# Patient Record
Sex: Male | Born: 1960 | Race: Black or African American | Hispanic: No | Smoking: Current every day smoker
Health system: Southern US, Community
[De-identification: ages and names within clinical notes are randomized; demographics above are authoritative.]

## PROBLEM LIST (undated history)

## (undated) DIAGNOSIS — I10 Essential (primary) hypertension: Secondary | ICD-10-CM

---

## 2000-10-05 ENCOUNTER — Emergency Department (HOSPITAL_COMMUNITY): Admission: EM | Admit: 2000-10-05 | Discharge: 2000-10-05 | Payer: Self-pay | Admitting: Family Medicine

## 2003-11-22 ENCOUNTER — Emergency Department (HOSPITAL_COMMUNITY): Admission: EM | Admit: 2003-11-22 | Discharge: 2003-11-22 | Payer: Self-pay | Admitting: Emergency Medicine

## 2004-06-04 ENCOUNTER — Emergency Department (HOSPITAL_COMMUNITY): Admission: EM | Admit: 2004-06-04 | Discharge: 2004-06-04 | Payer: Self-pay | Admitting: *Deleted

## 2004-06-10 ENCOUNTER — Emergency Department (HOSPITAL_COMMUNITY): Admission: EM | Admit: 2004-06-10 | Discharge: 2004-06-10 | Payer: Self-pay | Admitting: Emergency Medicine

## 2007-06-22 ENCOUNTER — Emergency Department (HOSPITAL_COMMUNITY): Admission: EM | Admit: 2007-06-22 | Discharge: 2007-06-22 | Payer: Self-pay | Admitting: Emergency Medicine

## 2010-03-28 ENCOUNTER — Emergency Department (HOSPITAL_COMMUNITY): Admission: EM | Admit: 2010-03-28 | Discharge: 2010-03-28 | Payer: Self-pay | Admitting: Emergency Medicine

## 2010-06-17 ENCOUNTER — Emergency Department (HOSPITAL_COMMUNITY)
Admission: EM | Admit: 2010-06-17 | Discharge: 2010-06-17 | Payer: Self-pay | Source: Home / Self Care | Admitting: Emergency Medicine

## 2010-07-31 LAB — URINE MICROSCOPIC-ADD ON

## 2010-07-31 LAB — URINALYSIS, ROUTINE W REFLEX MICROSCOPIC
Bilirubin Urine: NEGATIVE
Glucose, UA: NEGATIVE mg/dL
Ketones, ur: NEGATIVE mg/dL
pH: 6 (ref 5.0–8.0)

## 2011-10-26 ENCOUNTER — Encounter (HOSPITAL_COMMUNITY): Payer: Self-pay

## 2011-10-26 ENCOUNTER — Emergency Department (HOSPITAL_COMMUNITY)
Admission: EM | Admit: 2011-10-26 | Discharge: 2011-10-26 | Discharge status: Against Medical Advice | Payer: Self-pay | Attending: Emergency Medicine | Admitting: Emergency Medicine

## 2011-10-26 ENCOUNTER — Emergency Department (HOSPITAL_COMMUNITY): Payer: Self-pay

## 2011-10-26 DIAGNOSIS — F17200 Nicotine dependence, unspecified, uncomplicated: Secondary | ICD-10-CM | POA: Insufficient documentation

## 2011-10-26 DIAGNOSIS — R079 Chest pain, unspecified: Secondary | ICD-10-CM | POA: Insufficient documentation

## 2011-10-26 DIAGNOSIS — I10 Essential (primary) hypertension: Secondary | ICD-10-CM | POA: Insufficient documentation

## 2011-10-26 HISTORY — DX: Essential (primary) hypertension: I10

## 2011-10-26 LAB — BASIC METABOLIC PANEL
Calcium: 9.4 mg/dL (ref 8.4–10.5)
Creatinine, Ser: 0.74 mg/dL (ref 0.50–1.35)
GFR calc non Af Amer: >90 mL/min (ref >90)
Sodium: 130 mEq/L — ABNORMAL LOW (ref 135–145)

## 2011-10-26 LAB — CBC
HCT: 41.3 % (ref 39.0–52.0)
Hemoglobin: 14.3 g/dL (ref 13.0–17.0)
RBC: 4.83 MIL/uL (ref 4.22–5.81)
WBC: 4.9 10*3/uL (ref 4.0–10.5)

## 2011-10-26 LAB — TROPONIN I: Troponin I: <0.3 ng/mL (ref <0.30)

## 2011-10-26 NOTE — ED Provider Notes (Signed)
History     CSN: [id number]  Arrival date & time 10/26/11  1541   First MD Initiated Contact with Patient 10/26/11 1606      Chief Complaint  Patient presents with  . Chest Pain    (Consider location/radiation/quality/duration/timing/severity/associated sxs/prior treatment) HPI Comments: Patient was eating lunch around 2 pm when he developed mid sternal chest pressure,  Which he describes as "needing to burp" but was unable.  He was eating pinto beans heavy with onions and also has been drinking etoh,  Stating he had 2 shots of liquor and 3 beers prior to lunch.  He walked up here (about 20 minutes away) and states that his pain persisted,  resolved prior to arrival, then flared slightly upon first arrival,  But now is resolved.  He denies every having any similar symptoms,  Also denies shortness of breath,  Diaphoresis, nausea or vomiting.  He does not have a primary doctor,  Has known htn,  But states has never taken any bp medication.  He denies headaches,  Visual changes,  Shortness of breath and chest pain (historically before today).    Patient is a 51 y.o. male presenting with chest pain. The history is provided by the patient and a friend.  Chest Pain At its most intense, the pain is at 4/10. The pain is currently at 0/10. The pain does not radiate. Pertinent negatives for primary symptoms include no fever, no shortness of breath, no cough, no wheezing, no palpitations, no abdominal pain, no nausea and no dizziness.  Pertinent negatives for associated symptoms include no numbness and no weakness. He tried nothing for the symptoms. Risk factors include alcohol intake, male gender and smoking/tobacco exposure.  His past medical history is significant for hypertension.     Past Medical History  Diagnosis Date  . Hypertension     History reviewed. No pertinent past surgical history.  No family history on file.  History  Substance Use Topics  . Smoking status: Current Everyday  Smoker  . Smokeless tobacco: Not on file  . Alcohol Use: 2.4 oz/week    4 Cans of beer per week      Review of Systems  Constitutional: Negative for fever.  HENT: Negative for congestion, sore throat and neck pain.   Eyes: Negative.   Respiratory: Negative for cough, chest tightness, shortness of breath and wheezing.   Cardiovascular: Positive for chest pain. Negative for palpitations.  Gastrointestinal: Negative for nausea, abdominal pain, diarrhea and constipation.  Genitourinary: Negative.   Musculoskeletal: Negative for joint swelling and arthralgias.  Skin: Negative.  Negative for rash and wound.  Neurological: Negative for dizziness, weakness, light-headedness, numbness and headaches.  Hematological: Negative.   Psychiatric/Behavioral: Negative.     Allergies  Review of patient's allergies indicates no known allergies.  Home Medications  No current outpatient prescriptions on file.  BP 186/100  Resp 20  Ht 6\' 4"  (1.93 m)  Wt 192 lb (87.091 kg)  BMI 23.37 kg/m2  SpO2 96%  Physical Exam  Nursing note and vitals reviewed. Constitutional: He appears well-developed and well-nourished.  HENT:  Head: Normocephalic and atraumatic.  Eyes: Conjunctivae are normal.  Neck: Normal range of motion.  Cardiovascular: Normal rate, regular rhythm, normal heart sounds and intact distal pulses.   Pulmonary/Chest: Effort normal and breath sounds normal. He has no wheezes.  Abdominal: Soft. Bowel sounds are normal. There is no tenderness.  Musculoskeletal: Normal range of motion.  Neurological: He is alert.  Skin: Skin is warm and  dry.  Psychiatric: He has a normal mood and affect.    ED Course  Procedures (including critical care time)  Labs Reviewed  CBC - Abnormal; Notable for the following:    Platelets 136 (*)    All other components within normal limits  BASIC METABOLIC PANEL  TROPONIN I   No results found.   1. Chest pain, unspecified       MDM  Chest  pain of unclear etiology,  Patient refuses to stay for workup,  Stating he feels fine now,  And "knows it was the onions on top of the alcohol I ate".  He prefers to get seen by a family doctor and plans to establish care with Dr Felecia Shelling.  Discussed possible risks of leaving including heart attack and death.  Pt ok with taking this risk.  Advised to return for any return of sx.        Burgess Amor, [place] 10/26/11 1642   Date: 10/26/2011  Rate: 101 rhthm: sinus tachycardia  QRS Axis: left  Intervals: QT prolonged  ST/T Wave abnormalities: nonspecific ST/T changes  Conduction Disutrbances:none  Narrative Interpretation: anterior q waves  Old EKG Reviewed: none available    Burgess Amor, PA 10/26/11 1648

## 2011-10-26 NOTE — ED Notes (Signed)
Pt states he ate pinto beans and onions for lunch. States he then started having chest pain

## 2011-10-26 NOTE — Discharge Instructions (Signed)
Chest Pain (Nonspecific) It is often hard to give a specific diagnosis for the cause of chest pain. There is always a chance that your pain could be related to something serious, such as a heart attack or a blood clot in the lungs. You need to follow up with your caregiver for further evaluation. CAUSES   Heartburn.   Pneumonia or bronchitis.   Anxiety or stress.   Inflammation around your heart (pericarditis) or lung (pleuritis or pleurisy).   A blood clot in the lung.   A collapsed lung (pneumothorax). It can develop suddenly on its own (spontaneous pneumothorax) or from injury (trauma) to the chest.   Shingles infection (herpes zoster virus).  The chest wall is composed of bones, muscles, and cartilage. Any of these can be the source of the pain.  The bones can be bruised by injury.   The muscles or cartilage can be strained by coughing or overwork.   The cartilage can be affected by inflammation and become sore (costochondritis).  DIAGNOSIS  Lab tests or other studies, such as X-rays, electrocardiography, stress testing, or cardiac imaging, may be needed to find the cause of your pain.  TREATMENT   Treatment depends on what may be causing your chest pain. Treatment may include:   Acid blockers for heartburn.   Anti-inflammatory medicine.   Pain medicine for inflammatory conditions.   Antibiotics if an infection is present.   You may be advised to change lifestyle habits. This includes stopping smoking and avoiding alcohol, caffeine, and chocolate.   You may be advised to keep your head raised (elevated) when sleeping. This reduces the chance of acid going backward from your stomach into your esophagus.   Most of the time, nonspecific chest pain will improve within 2 to 3 days with rest and mild pain medicine.  HOME CARE INSTRUCTIONS   If antibiotics were prescribed, take your antibiotics as directed. Finish them even if you start to feel better.   For the next few  days, avoid physical activities that bring on chest pain. Continue physical activities as directed.   Do not smoke.   Avoid drinking alcohol.   Only take over-the-counter or prescription medicine for pain, discomfort, or fever as directed by your caregiver.   Follow your caregiver's suggestions for further testing if your chest pain does not go away.   Keep any follow-up appointments you made. If you do not go to an appointment, you could develop lasting (chronic) problems with pain. If there is any problem keeping an appointment, you must call to reschedule.  SEEK MEDICAL CARE IF:   You think you are having problems from the medicine you are taking. Read your medicine instructions carefully.   Your chest pain does not go away, even after treatment.   You develop a rash with blisters on your chest.  SEEK IMMEDIATE MEDICAL CARE IF:   You have increased chest pain or pain that spreads to your arm, neck, jaw, back, or abdomen.   You develop shortness of breath, an increasing cough, or you are coughing up blood.   You have severe back or abdominal pain, feel nauseous, or vomit.   You develop severe weakness, fainting, or chills.   You have a fever.  THIS IS AN EMERGENCY. Do not wait to see if the pain will go away. Get medical help at once. Call your local emergency services (911 in [place] Do not drive yourself to the hospital. MAKE SURE YOU:   Understand these instructions.     Will watch your condition.   Will get help right away if you are not doing well or get worse.  Document Released: 02/13/2005 Document Revised: 04/25/2011 Document Reviewed: 12/10/2007 [employer] Patient Information 2012 [employer]. 

## 2011-10-30 NOTE — ED Provider Notes (Signed)
Medical screening examination/treatment/procedure(s) were performed by non-physician practitioner and as supervising physician I was immediately available for consultation/collaboration.   Laray Anger, DO 10/30/11 1650

## 2017-06-24 ENCOUNTER — Encounter (HOSPITAL_COMMUNITY): Payer: Self-pay | Admitting: Emergency Medicine

## 2017-06-24 ENCOUNTER — Emergency Department (HOSPITAL_COMMUNITY)
Admission: EM | Admit: 2017-06-24 | Discharge: 2017-06-24 | Disposition: A | Discharge status: Home / Self Care | Payer: Self-pay | Attending: Emergency Medicine | Admitting: Emergency Medicine

## 2017-06-24 DIAGNOSIS — I10 Essential (primary) hypertension: Secondary | ICD-10-CM | POA: Insufficient documentation

## 2017-06-24 DIAGNOSIS — Y939 Activity, unspecified: Secondary | ICD-10-CM | POA: Insufficient documentation

## 2017-06-24 DIAGNOSIS — Y999 Unspecified external cause status: Secondary | ICD-10-CM | POA: Insufficient documentation

## 2017-06-24 DIAGNOSIS — F17210 Nicotine dependence, cigarettes, uncomplicated: Secondary | ICD-10-CM | POA: Insufficient documentation

## 2017-06-24 DIAGNOSIS — X500XXA Overexertion from strenuous movement or load, initial encounter: Secondary | ICD-10-CM | POA: Insufficient documentation

## 2017-06-24 DIAGNOSIS — Y929 Unspecified place or not applicable: Secondary | ICD-10-CM | POA: Insufficient documentation

## 2017-06-24 DIAGNOSIS — S39012A Strain of muscle, fascia and tendon of lower back, initial encounter: Secondary | ICD-10-CM | POA: Insufficient documentation

## 2017-06-24 MED ORDER — IBUPROFEN 600 MG PO TABS: 600.0000 mg | ORAL_TABLET | Freq: Four times a day (QID) | ORAL | 0 refills | Status: DC

## 2017-06-24 MED ORDER — CYCLOBENZAPRINE HCL 10 MG PO TABS: 10.0000 mg | ORAL_TABLET | Freq: Three times a day (TID) | ORAL | 0 refills | Status: DC

## 2017-06-24 NOTE — Discharge Instructions (Signed)
Please use a heating pad to your lower back.  Please rest her back as much as possible.  Use Flexeril 3 times daily for spasm pain. This medication may cause drowsiness. Please do not drink, drive, or participate in activity that requires concentration while taking this medication.  Use ibuprofen with breakfast, lunch, dinner, and at bedtime.

## 2017-06-24 NOTE — ED Triage Notes (Signed)
Pt reports picking something heavy up at work on Friday and has been having low back pain with bending or twisting.  States " I think I need some time off of work to rest my back a bit."

## 2017-06-24 NOTE — ED Provider Notes (Signed)
[facility] Provider Note   CSN: [id number] Arrival date & time: 06/24/17  1356     History   Chief Complaint Chief Complaint  Patient presents with  . Back Pain    HPI [facility] is a 57 y.o. male.  Patient is a 57 year old male who presents to the emergency department with a complaint of lower back pain.  Patient states that he was lifting a heavy object for 5 days ago and injured his lower back.  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  He did not have any loss of bowel or bladder function.  He has been able to ambulate, but states is been very stiff and has pain when he changes positions.  He is not had any recent operations or procedures involving his back.  He is not had any difficulty with numbness or unusual tingling in the saddle areas.  He presents now for evaluation and assistance with this pain.   The history is provided by the patient.    Past Medical History:  Diagnosis Date  . Hypertension     There are no active problems to display for this patient.   History reviewed. No pertinent surgical history.     Home Medications    Prior to Admission medications   Not on File    Family History History reviewed. No pertinent family history.  Social History Social History   Tobacco Use  . Smoking status: Current Every Day Smoker    Packs/day: 0.50    Types: Cigarettes  Substance Use Topics  . Alcohol use: Yes    Alcohol/week: 2.4 oz    Types: 4 Cans of beer per week    Comment: weekends  . Drug use: No     Allergies   Patient has no known allergies.   Review of Systems Review of Systems  Constitutional: Negative for activity change.       All ROS Neg except as noted in HPI  HENT: Negative for nosebleeds.   Eyes: Negative for photophobia and discharge.  Respiratory: Negative for cough, shortness of breath and wheezing.   Cardiovascular: Negative for chest pain and palpitations.    Gastrointestinal: Negative for abdominal pain and blood in stool.  Genitourinary: Negative for dysuria, frequency and hematuria.  Musculoskeletal: Positive for back pain. Negative for arthralgias and neck pain.  Skin: Negative.   Neurological: Negative for dizziness, seizures and speech difficulty.  Psychiatric/Behavioral: Negative for confusion and hallucinations.     Physical Exam Updated Vital Signs BP (!) 189/90 (BP Location: Right Arm)   Pulse 74   Temp 98.1 F (36.7 C) (Oral)   Resp 18   Ht 6' (1.829 m)   Wt 85.7 kg (189 lb)   SpO2 100%   BMI 25.63 kg/m   Physical Exam  Constitutional: He is oriented to person, place, and time. He appears well-developed and well-nourished.  Non-toxic appearance.  HENT:  Head: Normocephalic.  Right Ear: Tympanic membrane and external ear normal.  Left Ear: Tympanic membrane and external ear normal.  Eyes: EOM and lids are normal. Pupils are equal, round, and reactive to light.  Neck: Normal range of motion. Neck supple. Carotid bruit is not present.  Cardiovascular: Normal rate, regular rhythm, normal heart sounds, intact distal pulses and normal pulses.  Pulmonary/Chest: Breath sounds normal. No respiratory distress.  Abdominal: Soft. Bowel sounds are normal. There is no tenderness. There is no guarding.  Musculoskeletal: Normal range of motion.  Lumbar back: He exhibits pain and spasm.       Back:  Lymphadenopathy:       Head (right side): No submandibular adenopathy present.       Head (left side): No submandibular adenopathy present.    He has no cervical adenopathy.  Neurological: He is alert and oriented to person, place, and time. He has normal strength. No cranial nerve deficit or sensory deficit.  Skin: Skin is warm and dry.  Psychiatric: He has a normal mood and affect. His speech is normal.  Nursing note and vitals reviewed.    ED Treatments / Results  Labs (all labs ordered are listed, but only abnormal  results are displayed) Labs Reviewed - No data to display  EKG  EKG Interpretation None       Radiology No results found.  Procedures Procedures (including critical care time)  Medications Ordered in ED Medications - No data to display   Initial Impression / Assessment and Plan / ED Course  I have reviewed the triage vital signs and the nursing notes.  Pertinent labs & imaging results that were available during my care of the patient were reviewed by me and considered in my medical decision making (see chart for details).      Final Clinical Impressions(s) / ED Diagnoses MDM  Blood pressure is slightly elevated at 189/90, otherwise vital signs are within normal limits.  Pulse oximetry is 100% on room air.  Within normal limits by my interpretation.  No gross neurologic deficit appreciated.  I can reproduce the pain with range of motion.  No gross neurologic deficit appreciated.  Prescription for Flexeril and ibuprofen given to the patient.  Patient will follow up with his primary physician, and or orthopedics if not improving.  Patient is in agreement with this plan.   Final diagnoses:  Strain of lumbar region, initial encounter    ED Discharge Orders        Ordered    cyclobenzaprine (FLEXERIL) 10 MG tablet  3 times daily     06/24/17 1506    ibuprofen (ADVIL,MOTRIN) 600 MG tablet  4 times daily     06/24/17 1506       Ivery Quale, PA-C 06/24/17 1754    Raeford Razor, MD 06/25/17 1242

## 2019-02-28 ENCOUNTER — Emergency Department (HOSPITAL_COMMUNITY)
Admission: EM | Admit: 2019-02-28 | Discharge: 2019-02-28 | Disposition: A | Discharge status: Home / Self Care | Payer: Self-pay | Attending: Emergency Medicine | Admitting: Emergency Medicine

## 2019-02-28 ENCOUNTER — Encounter (HOSPITAL_COMMUNITY): Payer: Self-pay

## 2019-02-28 ENCOUNTER — Other Ambulatory Visit: Payer: Self-pay

## 2019-02-28 DIAGNOSIS — F17210 Nicotine dependence, cigarettes, uncomplicated: Secondary | ICD-10-CM | POA: Insufficient documentation

## 2019-02-28 DIAGNOSIS — M545 Low back pain, unspecified: Secondary | ICD-10-CM

## 2019-02-28 DIAGNOSIS — I10 Essential (primary) hypertension: Secondary | ICD-10-CM | POA: Insufficient documentation

## 2019-02-28 LAB — URINALYSIS, ROUTINE W REFLEX MICROSCOPIC
Bacteria, UA: NONE SEEN
Bilirubin Urine: NEGATIVE
Glucose, UA: NEGATIVE mg/dL
Hgb urine dipstick: NEGATIVE
Ketones, ur: NEGATIVE mg/dL
Leukocytes,Ua: NEGATIVE
Nitrite: NEGATIVE
Protein, ur: 100 mg/dL — AB
Specific Gravity, Urine: 1.021 (ref 1.005–1.030)
pH: 6 (ref 5.0–8.0)

## 2019-02-28 MED ORDER — AMLODIPINE BESYLATE 5 MG PO TABS: 5.0000 mg | ORAL_TABLET | Freq: Every day | ORAL | 0 refills | Status: DC

## 2019-02-28 MED ORDER — TRAMADOL HCL 50 MG PO TABS: 50.0000 mg | ORAL_TABLET | Freq: Four times a day (QID) | ORAL | 0 refills | Status: DC | PRN

## 2019-02-28 MED ORDER — CYCLOBENZAPRINE HCL 10 MG PO TABS: 10.0000 mg | ORAL_TABLET | Freq: Three times a day (TID) | ORAL | 0 refills | Status: DC | PRN

## 2019-02-28 NOTE — ED Notes (Signed)
Pt ambulatory in hall twice.  Given two cups of sprite.  Pt moving all extremities well.  Pt asking " how much longer is he going to stay here"

## 2019-02-28 NOTE — ED Triage Notes (Signed)
Pt reports pain in r lower back that started when he got up this morning.

## 2019-02-28 NOTE — ED Provider Notes (Signed)
[facility] EMERGENCY DEPARTMENT Provider Note   CSN: [id number] Arrival date & time: 02/28/19  0907     History   Chief Complaint Chief Complaint  Patient presents with  . Back Pain    HPI [facility] is a 59 y.o. male.     HPI   [facility] is a 58 y.o. male who presents to the Emergency Department complaining of right-sided low back pain that has been waxing and waning for 1-1/2 weeks.  He states he woke this morning and the pain was worse.  Has been localized to the right side of his lower back.  Pain is aggravated by walking and certain movements.  Improves at rest.  He denies known injury.  Pain is nonradiating.  He has not tried any medications for symptom relief.  He denies fever, chills, abdominal pain, pain numbness or weakness of his lower extremities, urine or bowel changes.  No history of kidney stones. No pain or swelling of the testicles.  No penile discharge.     Past Medical History:  Diagnosis Date  . Hypertension     There are no active problems to display for this patient.   History reviewed. No pertinent surgical history.     Home Medications    Prior to Admission medications   Medication Sig Start Date End Date Taking? Authorizing Provider  cyclobenzaprine (FLEXERIL) 10 MG tablet Take 1 tablet (10 mg total) by mouth 3 (three) times daily. 06/24/17   Lily Kocher, PA-C  ibuprofen (ADVIL,MOTRIN) 600 MG tablet Take 1 tablet (600 mg total) by mouth 4 (four) times daily. 06/24/17   Lily Kocher, PA-C    Family History No family history on file.  Social History Social History   Tobacco Use  . Smoking status: Current Every Day Smoker    Packs/day: 0.50    Types: Cigarettes  . Smokeless tobacco: Never Used  Substance Use Topics  . Alcohol use: Yes    Alcohol/week: 4.0 standard drinks    Types: 4 Cans of beer per week    Comment: weekends  . Drug use: No     Allergies   Patient has no known allergies.   Review of Systems  Review of Systems  Constitutional: Negative for fever.  Respiratory: Negative for shortness of breath.   Cardiovascular: Negative for chest pain.  Gastrointestinal: Negative for abdominal pain, constipation, diarrhea, nausea and vomiting.  Genitourinary: Negative for decreased urine volume, difficulty urinating, discharge, dysuria, flank pain, hematuria, penile swelling and testicular pain.  Musculoskeletal: Positive for back pain. Negative for joint swelling.  Skin: Negative for rash.  Neurological: Negative for weakness and numbness.     Physical Exam Updated Vital Signs BP (!) 210/109 (BP Location: Right Arm)   Pulse 97   Temp 99 F (37.2 C) (Oral)   Resp 18   Ht 6' (1.829 m)   Wt 72.6 kg   SpO2 100%   BMI 21.70 kg/m   Physical Exam Vitals signs and nursing note reviewed.  Constitutional:      General: He is not in acute distress.    Appearance: Normal appearance. He is well-developed.  HENT:     Head: Normocephalic.  Neck:     Musculoskeletal: Normal range of motion and neck supple.  Cardiovascular:     Rate and Rhythm: Normal rate and regular rhythm.     Pulses: Normal pulses.     Comments: DP pulses are strong and palpable bilaterally Pulmonary:     Effort:  Pulmonary effort is normal. No respiratory distress.     Breath sounds: Normal breath sounds.  Abdominal:     General: There is no distension.     Palpations: Abdomen is soft.     Tenderness: There is no abdominal tenderness. There is no right CVA tenderness or left CVA tenderness.  Musculoskeletal:        General: Tenderness present.     Lumbar back: He exhibits tenderness and pain. He exhibits normal range of motion, no swelling, no deformity, no laceration and normal pulse.     Comments: Focal ttp of the right lumbar paraspinal muscles.  No spinal tenderness.  Pt has 5/5 strength against resistance of bilateral lower extremities.  Negative SLR bilaterally   Skin:    General: Skin is warm.     Capillary  Refill: Capillary refill takes less than 2 seconds.     Findings: No rash.  Neurological:     Mental Status: He is alert and oriented to person, place, and time.     Motor: No weakness or abnormal muscle tone.     Coordination: Coordination normal.     Gait: Gait normal.     Deep Tendon Reflexes:     Reflex Scores:      Patellar reflexes are 2+ on the right side and 2+ on the left side.      Achilles reflexes are 2+ on the right side and 2+ on the left side.     ED Treatments / Results  Labs (all labs ordered are listed, but only abnormal results are displayed) Labs Reviewed  URINALYSIS, ROUTINE W REFLEX MICROSCOPIC - Abnormal; Notable for the following components:      Result Value   Color, Urine AMBER (*)    APPearance HAZY (*)    Protein, ur 100 (*)    All other components within normal limits    EKG None  Radiology No results found.  Procedures Procedures (including critical care time)  Medications Ordered in ED Medications - No data to display   Initial Impression / Assessment and Plan / ED Course  I have reviewed the triage vital signs and the nursing notes.  Pertinent labs & imaging results that were available during my care of the patient were reviewed by me and considered in my medical decision making (see chart for details).        Pt is is hypertensive on arrival.  No known hx, but has not seen a primary provider "in a long time."  Denies symptoms 1200  on recheck, BP 191/97 I will start him on low dose norvasc and he agrees to arrange care with [facility] and I will also give info for the free clinic.  He understands that his blood pressure will need to be rechecked in 1 to 2 weeks.  Back pain is felt to be musculoskeletal.  No focal neuro deficits.  Ambulates with a steady gait.  Return precautions discussed.  Final Clinical Impressions(s) / ED Diagnoses   Final diagnoses:  Acute right-sided low back pain without sciatica  Hypertension,  unspecified type    ED Discharge Orders    None       Pauline Aus, PA-C 02/28/19 1222    Vanetta Mulders, MD 03/04/19 1531

## 2019-02-28 NOTE — Discharge Instructions (Addendum)
Alternate ice and heat to your back.  Avoid twisting or heavy lifting for at least 1 week.  Your blood pressure today is elevated.  I am starting you on a low dose blood pressure medication to take once daily.  You will need to have your blood pressure rechecked in 1 to 2 weeks.  I have listed 2 of the local clinics that you can contact to establish primary care.  Return to the ER for any worsening symptoms.

## 2019-03-02 ENCOUNTER — Telehealth: Payer: Self-pay

## 2019-03-02 NOTE — Congregational Nurse Program (Signed)
  Dept: [phone]   Congregational Nurse Program Note  Date of Encounter: 03/02/2019  Past Medical History: Past Medical History:  Diagnosis Date  . Hypertension     Encounter Details: CNP Questionnaire - 03/02/19 1548      Questionnaire   Patient Status  Not Applicable    Race  Black or African American    Location Patient Served At  [facility]  Not Applicable    Uninsured  Uninsured (NEW 1x/quarter)    Food  No food insecurities    Housing/Utilities  Yes, have permanent housing    Transportation  No transportation needs    Interpersonal Safety  Yes, feel physically and emotionally safe where you currently live    Medication  Yes, have medication insecurities;Provided medication assistance    Medical Provider  No    Referrals  Medication Assistance;[facility];Primary Care Provider/Clinic    ED Visit Averted  Yes    Life-Saving Intervention Made  Not Applicable       Client recently seen at [facility] on 02/28/19 for lower back pain. During visit it was discovered that client was hypertensive. Prescriptions sent to [facility] for  Amlodipine 5 mg one tablet by mouth daily Tramadol 50 mg Flexeril 10 mg.  Client reports his back is no longer hurting, but states he was referred to [place] for medical care and help with medications. Explained to client this office is not a primary medical care, however we can help connect him to a medical provider. He reports no "family physician" for many years. Never knew he had hypertension until ER visit.  Explained to client we cannot help with Tramadol and he says he doesn't need it. Will fax Reva Bores voucher for a one time assistance with amlodipine and client is to begin today, confirmed that prescription is ready at pharmacy.  Client is alert and oriented to person, place and time. He denies any shortness of breath, chest pains, gait is steady and balance is steady. Denies weakness of  extremities, no facial drooping, face symmetrical. No slurred speech. Denies headache or vision changes.  Client works cleaning carpets. He is uninsured. He lives with total of 3 in household. Client referred for [facility] to refer [facility]. Client met with Waylan Boga to begin application process.   Today: Blood pressure 180/100 , pulse 100 REsp 14, oxygen Saturation 98%.   Strict instructions to begin blood pressure tonight, discussed decreasing salt intake, fried fatty foods and smoking cessation. Discussed the risks of uncontrolled high blood pressure. Handouts given on hypertension, smoking and stroke prevention.  Reviewed symptoms of heart attack and stroke and to call 911 for any of the symptoms. These were written down and given to client along with handouts and voucher to pick up medication at [facility].  Discussed client coming in on Thursday 03/04/19 for BP check. Client agreeable. Discussed the importance of completing [facility] Connect application in order to get him to a primary care provider ASAP. Client reports understanding.  Will continue to follow as needed.  Debria Garret RN

## 2019-03-16 ENCOUNTER — Emergency Department (HOSPITAL_COMMUNITY)
Admission: EM | Admit: 2019-03-16 | Discharge: 2019-03-16 | Disposition: A | Discharge status: Home / Self Care | Payer: Self-pay | Attending: Emergency Medicine | Admitting: Emergency Medicine

## 2019-03-16 ENCOUNTER — Other Ambulatory Visit: Payer: Self-pay

## 2019-03-16 ENCOUNTER — Encounter (HOSPITAL_COMMUNITY): Payer: Self-pay

## 2019-03-16 DIAGNOSIS — I10 Essential (primary) hypertension: Secondary | ICD-10-CM | POA: Insufficient documentation

## 2019-03-16 DIAGNOSIS — R109 Unspecified abdominal pain: Secondary | ICD-10-CM | POA: Insufficient documentation

## 2019-03-16 DIAGNOSIS — Z79899 Other long term (current) drug therapy: Secondary | ICD-10-CM | POA: Insufficient documentation

## 2019-03-16 DIAGNOSIS — F17210 Nicotine dependence, cigarettes, uncomplicated: Secondary | ICD-10-CM | POA: Insufficient documentation

## 2019-03-16 LAB — URINALYSIS, ROUTINE W REFLEX MICROSCOPIC
Bilirubin Urine: NEGATIVE
Glucose, UA: NEGATIVE mg/dL
Ketones, ur: NEGATIVE mg/dL
Leukocytes,Ua: NEGATIVE
Nitrite: NEGATIVE
Protein, ur: >=300 mg/dL — AB
Specific Gravity, Urine: 1.025 (ref 1.005–1.030)
pH: 6 (ref 5.0–8.0)

## 2019-03-16 MED ORDER — CYCLOBENZAPRINE HCL 10 MG PO TABS: 10.0000 mg | ORAL_TABLET | Freq: Three times a day (TID) | ORAL | 0 refills | Status: DC | PRN

## 2019-03-16 NOTE — ED Triage Notes (Signed)
Pt presents to ED with complaints of right sided flank pain since this morning. Pt denies UA symptoms and fever.

## 2019-03-16 NOTE — ED Provider Notes (Signed)
[facility] EMERGENCY DEPARTMENT Provider Note   CSN: [id number] Arrival date & time: 03/16/19  1051     History   Chief Complaint Chief Complaint  Patient presents with  . Flank Pain    HPI Peter Meyer is a 58 y.o. male.     Patient is a 58 year old male with history of hypertension.  He presents today with complaints of pain in his right side.  This began this morning while he was at work.  It began while he was lifting a 40 pound box.  He denies any radiation of his discomfort into his legs.  He denies any fevers or chills.  He denies any weakness, numbness, or tingling.  He denies any bowel or bladder complaints.  Pain is actually improved significantly while waiting in the waiting room an extended period of time.  The history is provided by the patient.  Flank Pain This is a new problem. Episode onset: 9 AM. The problem occurs constantly. The problem has been resolved. Pertinent negatives include no abdominal pain. The symptoms are aggravated by bending and twisting. Nothing relieves the symptoms. He has tried nothing for the symptoms.    Past Medical History:  Diagnosis Date  . Hypertension     There are no active problems to display for this patient.   History reviewed. No pertinent surgical history.      Home Medications    Prior to Admission medications   Medication Sig Start Date End Date Taking? Authorizing Provider  amLODipine (NORVASC) 5 MG tablet Take 1 tablet (5 mg total) by mouth daily. 02/28/19   Triplett, Tammy, PA-C  cyclobenzaprine (FLEXERIL) 10 MG tablet Take 1 tablet (10 mg total) by mouth 3 (three) times daily as needed. 02/28/19   Triplett, Tammy, PA-C  ibuprofen (ADVIL,MOTRIN) 600 MG tablet Take 1 tablet (600 mg total) by mouth 4 (four) times daily. Patient not taking: Reported on 02/28/2019 06/24/17   Ivery Quale, PA-C  traMADol (ULTRAM) 50 MG tablet Take 1 tablet (50 mg total) by mouth every 6 (six) hours as needed. 02/28/19    Triplett, Babette Relic, PA-C    Family History No family history on file.  Social History Social History   Tobacco Use  . Smoking status: Current Every Day Smoker    Packs/day: 0.50    Types: Cigarettes  . Smokeless tobacco: Never Used  Substance Use Topics  . Alcohol use: Yes    Alcohol/week: 4.0 standard drinks    Types: 4 Cans of beer per week    Comment: weekends  . Drug use: No     Allergies   Patient has no known allergies.   Review of Systems Review of Systems  Gastrointestinal: Negative for abdominal pain.  Genitourinary: Positive for flank pain.  All other systems reviewed and are negative.    Physical Exam Updated Vital Signs BP (!) 191/111 (BP Location: Right Arm)   Pulse 87   Temp 98.9 F (37.2 C) (Oral)   Resp 18   Ht 6\' 8"  (2.032 m)   Wt 81.6 kg   SpO2 100%   BMI 19.77 kg/m   Physical Exam Vitals signs and nursing note reviewed.  Constitutional:      General: He is not in acute distress.    Appearance: He is well-developed. He is not diaphoretic.  HENT:     Head: Normocephalic and atraumatic.  Neck:     Musculoskeletal: Normal range of motion and neck supple.  Cardiovascular:     Rate and  Rhythm: Normal rate and regular rhythm.     Heart sounds: No murmur. No friction rub.  Pulmonary:     Effort: Pulmonary effort is normal. No respiratory distress.     Breath sounds: Normal breath sounds. No wheezing or rales.  Abdominal:     General: Bowel sounds are normal. There is no distension.     Palpations: Abdomen is soft.     Tenderness: There is no abdominal tenderness. There is no right CVA tenderness or left CVA tenderness.  Musculoskeletal: Normal range of motion.  Skin:    General: Skin is warm and dry.  Neurological:     Mental Status: He is alert and oriented to person, place, and time.     Coordination: Coordination normal.      ED Treatments / Results  Labs (all labs ordered are listed, but only abnormal results are displayed)  Labs Reviewed  URINALYSIS, ROUTINE W REFLEX MICROSCOPIC    EKG None  Radiology No results found.  Procedures Procedures (including critical care time)  Medications Ordered in ED Medications - No data to display   Initial Impression / Assessment and Plan / ED Course  I have reviewed the triage vital signs and the nursing notes.  Pertinent labs & imaging results that were available during my care of the patient were reviewed by me and considered in my medical decision making (see chart for details).  Patient presenting here with complaints of pain in his right side.  This began while he was at work lifting a heavy object.  He now feels better and his pain is resolved.  I suspect a musculoskeletal etiology.  His urinalysis is essentially clear.  Patient offered additional work-up, however states he feels better and wants to go home.  He prefers to return if symptoms worsen rather than undergo additional testing.  Final Clinical Impressions(s) / ED Diagnoses   Final diagnoses:  None    ED Discharge Orders    None       Veryl Speak, MD 03/16/19 1616

## 2019-03-16 NOTE — Discharge Instructions (Addendum)
Take ibuprofen 600 mg every 6 hours as needed for pain.  You can take Flexeril as prescribed as needed for pain not relieved with ibuprofen.  Follow-up with your primary doctor in the next week if not improving, and return to the ER if symptoms significantly worsen or change in the meantime.

## 2020-05-10 NOTE — Telephone Encounter (Signed)
na

## 2020-05-23 ENCOUNTER — Encounter (HOSPITAL_COMMUNITY): Payer: Self-pay | Admitting: Emergency Medicine

## 2020-05-23 ENCOUNTER — Other Ambulatory Visit: Payer: Self-pay

## 2020-05-23 ENCOUNTER — Emergency Department (HOSPITAL_COMMUNITY)
Admission: EM | Admit: 2020-05-23 | Discharge: 2020-05-23 | Disposition: A | Discharge status: Home / Self Care | Payer: PRIVATE HEALTH INSURANCE | Attending: Emergency Medicine | Admitting: Emergency Medicine

## 2020-05-23 DIAGNOSIS — M545 Low back pain, unspecified: Secondary | ICD-10-CM | POA: Insufficient documentation

## 2020-05-23 DIAGNOSIS — I10 Essential (primary) hypertension: Secondary | ICD-10-CM | POA: Diagnosis not present

## 2020-05-23 DIAGNOSIS — F17210 Nicotine dependence, cigarettes, uncomplicated: Secondary | ICD-10-CM | POA: Insufficient documentation

## 2020-05-23 DIAGNOSIS — Z79899 Other long term (current) drug therapy: Secondary | ICD-10-CM | POA: Insufficient documentation

## 2020-05-23 MED ORDER — CYCLOBENZAPRINE HCL 10 MG PO TABS: 10.0000 mg | ORAL_TABLET | Freq: Three times a day (TID) | ORAL | 0 refills | Status: DC | PRN

## 2020-05-23 MED ORDER — AMLODIPINE BESYLATE 10 MG PO TABS: 10.0000 mg | ORAL_TABLET | Freq: Every day | ORAL | 0 refills | Status: DC

## 2020-05-23 NOTE — ED Provider Notes (Signed)
[facility] EMERGENCY DEPARTMENT Provider Note  CSN: [id number] Arrival date & time: 05/23/20 0534    History Chief Complaint  Patient presents with  . Back Pain    HPI  Peter Meyer is a 60 y.o. male with history of poorly controlled HTN and low back pain reports onset of moderate aching pain in R lower back worse with Meyer and not associated with any radicular pain. He has not had a fall or injury. In the past this area has bothered him with heavy lifting. He admits to medication non-compliance and does not have a PCP. He smokes and has had a cough for the last few days as well. Denies fever.    Past Medical History:  Diagnosis Date  . Hypertension     History reviewed. No pertinent surgical history.  History reviewed. No pertinent family history.  Social History   Tobacco Use  . Smoking status: Current Every Day Smoker    Packs/day: 0.50    Types: Cigarettes  . Smokeless tobacco: Never Used  Substance Use Topics  . Alcohol use: Yes    Alcohol/week: 4.0 standard drinks    Types: 4 Cans of beer per week    Comment: weekends  . Drug use: No     Home Medications Prior to Admission medications   Medication Sig Start Date End Date Taking? Authorizing Provider  amLODipine (NORVASC) 10 MG tablet Take 1 tablet (10 mg total) by mouth daily. 05/23/20   Pollyann Savoy, MD  cyclobenzaprine (FLEXERIL) 10 MG tablet Take 1 tablet (10 mg total) by mouth 3 (three) times daily as needed for muscle spasms. 05/23/20   Pollyann Savoy, MD     Allergies    Patient has no known allergies.   Review of Systems   Review of Systems A comprehensive review of systems was completed and negative except as noted in HPI.    Physical Exam BP (!) 197/95   Pulse 86   Temp 98.8 F (37.1 C) (Oral)   Resp 18   Ht 6\' 8"  (2.032 m)   Wt 83 kg   SpO2 95%   BMI 20.10 kg/m   Physical Exam Vitals and nursing note reviewed.  Constitutional:      Appearance: Normal  appearance.  HENT:     Head: Normocephalic and atraumatic.     Nose: Nose normal.     Mouth/Throat:     Mouth: Mucous membranes are moist.  Eyes:     Extraocular Movements: Extraocular movements intact.     Conjunctiva/sclera: Conjunctivae normal.  Cardiovascular:     Rate and Rhythm: Normal rate.  Pulmonary:     Effort: Pulmonary effort is normal.     Breath sounds: Normal breath sounds.  Abdominal:     General: Abdomen is flat.     Palpations: Abdomen is soft.     Tenderness: There is no abdominal tenderness.  Musculoskeletal:        General: Tenderness (R lumbar paraspinal muscles) present. No swelling. Normal range of motion.     Cervical back: Neck supple.  Skin:    General: Skin is warm and dry.  Neurological:     General: No focal deficit present.     Mental Status: He is alert.     Sensory: No sensory deficit.     Motor: No weakness.     Gait: Gait normal.  Psychiatric:        Mood and Affect: Mood normal.  ED Results / Procedures / Treatments   Labs (all labs ordered are listed, but only abnormal results are displayed) Labs Reviewed - No data to display  EKG None  Radiology No results found.  Procedures Procedures  Medications Ordered in the ED Medications - No data to display   MDM Rules/Calculators/A&P MDM Patient noted again to be hypertensive in the ED. I discussed with him that long term uncontrolled HTN can cause damage to organs such as his heart or kidneys but he declines any additional evaluation for that today. He will agree to restart his prior amlodipine but he was cautioned that if he does not establish with PCP for refills and monitoring that his BP would likely remain elevated. He understands this risk. He has previously taken Motrin but given unknown kidney function will avoid NSAIDs. Plan APAP and flexeril for back pain. Amlodipine for BP and PCP referral.  ED Course  I have reviewed the triage vital signs and the nursing  notes.  Pertinent labs & imaging results that were available during my care of the patient were reviewed by me and considered in my medical decision making (see chart for details).     Final Clinical Impression(s) / ED Diagnoses Final diagnoses:  Acute right-sided low back pain without sciatica  Hypertension, unspecified type    Rx / DC Orders ED Discharge Orders         Ordered    amLODipine (NORVASC) 10 MG tablet  Daily        05/23/20 0728    cyclobenzaprine (FLEXERIL) 10 MG tablet  3 times daily PRN        05/23/20 0728           Pollyann Savoy, MD 05/23/20 [phone]

## 2020-05-23 NOTE — ED Triage Notes (Signed)
Pt with c/o lower back pain x 1 day. Denies injury.

## 2020-05-23 NOTE — Discharge Instructions (Addendum)
You can take Tylenol over the counter for your back pain in addition to the flexeril muscle relaxer prescribed to you today. Please restart taking amlodipine for your blood pressure and call the [facility] to get a follow up appointment for further evaluation and management of your blood pressure.

## 2020-06-02 ENCOUNTER — Other Ambulatory Visit: Payer: Self-pay

## 2020-06-02 ENCOUNTER — Ambulatory Visit
Admission: EM | Admit: 2020-06-02 | Discharge: 2020-06-02 | Discharge status: Absent without leave | Payer: PRIVATE HEALTH INSURANCE

## 2020-06-07 ENCOUNTER — Encounter (HOSPITAL_COMMUNITY): Payer: Self-pay | Admitting: Emergency Medicine

## 2020-06-07 ENCOUNTER — Emergency Department (HOSPITAL_COMMUNITY)
Admission: EM | Admit: 2020-06-07 | Discharge: 2020-06-07 | Disposition: A | Discharge status: Home / Self Care | Payer: PRIVATE HEALTH INSURANCE | Attending: Emergency Medicine | Admitting: Emergency Medicine

## 2020-06-07 ENCOUNTER — Emergency Department (HOSPITAL_COMMUNITY): Payer: PRIVATE HEALTH INSURANCE

## 2020-06-07 ENCOUNTER — Other Ambulatory Visit: Payer: Self-pay

## 2020-06-07 DIAGNOSIS — W000XXA Fall on same level due to ice and snow, initial encounter: Secondary | ICD-10-CM | POA: Insufficient documentation

## 2020-06-07 DIAGNOSIS — I10 Essential (primary) hypertension: Secondary | ICD-10-CM | POA: Diagnosis not present

## 2020-06-07 DIAGNOSIS — Z79899 Other long term (current) drug therapy: Secondary | ICD-10-CM | POA: Diagnosis not present

## 2020-06-07 DIAGNOSIS — F17210 Nicotine dependence, cigarettes, uncomplicated: Secondary | ICD-10-CM | POA: Diagnosis not present

## 2020-06-07 DIAGNOSIS — M25532 Pain in left wrist: Secondary | ICD-10-CM | POA: Diagnosis present

## 2020-06-07 MED ORDER — AMLODIPINE BESYLATE 5 MG PO TABS
10.0000 mg | ORAL_TABLET | Freq: Once | ORAL | Status: AC
  Administered 2020-06-07: 10 mg via ORAL
  Filled 2020-06-07: qty 2

## 2020-06-07 NOTE — ED Notes (Addendum)
Entered room and introduced self to patient. Call bell within reach. Pt requesting discharge at this time, educated on vital sign recheck prior to discharge. Pt verbalized understanding and in agreement at this time.

## 2020-06-07 NOTE — ED Provider Notes (Signed)
[facility] EMERGENCY DEPARTMENT Provider Note   CSN: [id number] Arrival date & time: 06/07/20  6734     History Chief Complaint  Patient presents with  . Hand Pain    DERREN Meyer is a 60 y.o. male.  Patient presents with left wrist pain.  He states that he slipped and fell on the ice last night and had persistent left wrist pain since then.  Denies loss of consciousness or head injury.  Denies pain elsewhere such as lower back pain or neck pain.  Complaining only of left wrist pain.  Denies elbow or shoulder pain.  Describes a sharp aching pain worse when he moves his left wrist.  No fever no cough no vomiting no diarrhea.        Past Medical History:  Diagnosis Date  . Hypertension     There are no problems to display for this patient.   History reviewed. No pertinent surgical history.     No family history on file.  Social History   Tobacco Use  . Smoking status: Current Every Day Smoker    Packs/day: 0.50    Types: Cigarettes  . Smokeless tobacco: Never Used  Substance Use Topics  . Alcohol use: Yes    Alcohol/week: 4.0 standard drinks    Types: 4 Cans of beer per week    Comment: weekends  . Drug use: No    Home Medications Prior to Admission medications   Medication Sig Start Date End Date Taking? Authorizing Provider  amLODipine (NORVASC) 10 MG tablet Take 1 tablet (10 mg total) by mouth daily. 05/23/20   Pollyann Savoy, MD  cyclobenzaprine (FLEXERIL) 10 MG tablet Take 1 tablet (10 mg total) by mouth 3 (three) times daily as needed for muscle spasms. 05/23/20   Pollyann Savoy, MD    Allergies    Patient has no known allergies.  Review of Systems   Review of Systems  Constitutional: Negative for fever.  HENT: Negative for ear pain and sore throat.   Eyes: Negative for pain.  Respiratory: Negative for cough.   Cardiovascular: Negative for chest pain.  Gastrointestinal: Negative for abdominal pain.  Genitourinary: Negative for flank  pain.  Musculoskeletal: Negative for back pain.  Skin: Negative for color change and rash.  Neurological: Negative for syncope.  All other systems reviewed and are negative.   Physical Exam Updated Vital Signs BP (!) 187/113   Pulse 83   Temp 98.1 F (36.7 C)   Resp 18   Ht 6' (1.829 m)   Wt 83 kg   SpO2 99%   BMI 24.82 kg/m   Physical Exam Constitutional:      General: He is not in acute distress.    Appearance: He is well-developed.  HENT:     Head: Normocephalic.     Nose: Nose normal.  Eyes:     Extraocular Movements: Extraocular movements intact.  Cardiovascular:     Rate and Rhythm: Normal rate.  Pulmonary:     Effort: Pulmonary effort is normal.  Musculoskeletal:     Comments: Left wrist tender to palpation along ulnar aspect of left wrist.  Mild swelling present.  Neurovascular intact distally otherwise.  Intact radial ulnar median nerve compartments soft.  No open lesions noted.  No snuffbox tenderness noted.  Skin:    Coloration: Skin is not jaundiced.  Neurological:     Mental Status: He is alert. Mental status is at baseline.     ED Results / Procedures /  Treatments   Labs (all labs ordered are listed, but only abnormal results are displayed) Labs Reviewed - No data to display  EKG None  Radiology DG Wrist Complete Left  Result Date: 06/07/2020 CLINICAL DATA:  Fall.  Pain. EXAM: LEFT WRIST - COMPLETE 3+ VIEW COMPARISON:  No prior. FINDINGS: Degenerative changes, most prominent the first carpometacarpal joint. Corticated bony density noted adjacent to the radial styloid consistent with old fracture fragment. No acute bony or joint abnormality. No evidence of acute fracture or dislocation. IMPRESSION: Degenerative changes, most prominent the first carpometacarpal joint. Old fracture fragment noted adjacent to the radial styloid. No acute abnormality identified. Electronically Signed   By: Maisie Fus  Register   On: 06/07/2020 07:47   DG Hand Complete  Left  Result Date: 06/07/2020 CLINICAL DATA:  Left hand and wrist pain. EXAM: LEFT HAND - COMPLETE 3+ VIEW COMPARISON:  No prior. FINDINGS: Diffuse degenerative change. Degenerative changes most prominent the first carpometacarpal joint. Corticated bony density noted adjacent to the radial styloid. This is consistent old fracture fragment. No acute abnormality identified. No evidence of acute fracture or dislocation. IMPRESSION: Diffuse degenerative change. Degenerative changes most prominent at the first carpometacarpal joint. Corticated bony density noted adjacent to the radial styloid, this is consistent with old fracture fragment. No acute abnormality. Electronically Signed   By: Maisie Fus  Register   On: 06/07/2020 07:47    Procedures Procedures (including critical care time)  Medications Ordered in ED Medications - No data to display  ED Course  I have reviewed the triage vital signs and the nursing notes.  Pertinent labs & imaging results that were available during my care of the patient were reviewed by me and considered in my medical decision making (see chart for details).    MDM Rules/Calculators/A&P                          Strays shows no acute fracture.  Patient placed in left wrist splint for comfort.  Norvasc intact after placement of splint.  Pain medications here today.  Advise outpatient follow-up with his doctor within the week.  Advising immediate return for worsening pain fevers or any additional concerns.   Final Clinical Impression(s) / ED Diagnoses Final diagnoses:  Left wrist pain    Rx / DC Orders ED Discharge Orders    None       Cheryll Cockayne, MD 06/07/20 [phone]

## 2020-06-07 NOTE — ED Triage Notes (Signed)
Pt c/o left hand/wrist pain after slipping on ice last night.

## 2020-06-07 NOTE — Discharge Instructions (Addendum)
Call your primary care doctor or specialist as discussed in the next 2-3 days.   Return immediately back to the ER if:  Your symptoms worsen within the next 12-24 hours. You develop new symptoms such as new fevers, persistent vomiting, new pain, shortness of breath, or new weakness or numbness, or if you have any other concerns.  

## 2020-08-02 ENCOUNTER — Encounter (HOSPITAL_COMMUNITY): Payer: Self-pay | Admitting: Emergency Medicine

## 2020-08-02 ENCOUNTER — Other Ambulatory Visit: Payer: Self-pay

## 2020-08-02 ENCOUNTER — Emergency Department (HOSPITAL_COMMUNITY)
Admission: EM | Admit: 2020-08-02 | Discharge: 2020-08-02 | Disposition: A | Discharge status: Home / Self Care | Payer: Self-pay | Attending: Emergency Medicine | Admitting: Emergency Medicine

## 2020-08-02 ENCOUNTER — Emergency Department (HOSPITAL_COMMUNITY): Payer: Self-pay

## 2020-08-02 DIAGNOSIS — Z79899 Other long term (current) drug therapy: Secondary | ICD-10-CM | POA: Insufficient documentation

## 2020-08-02 DIAGNOSIS — F17210 Nicotine dependence, cigarettes, uncomplicated: Secondary | ICD-10-CM | POA: Insufficient documentation

## 2020-08-02 DIAGNOSIS — I10 Essential (primary) hypertension: Secondary | ICD-10-CM

## 2020-08-02 DIAGNOSIS — H409 Unspecified glaucoma: Secondary | ICD-10-CM

## 2020-08-02 MED ORDER — AMLODIPINE BESYLATE 5 MG PO TABS
10.0000 mg | ORAL_TABLET | Freq: Once | ORAL | Status: AC
  Administered 2020-08-02: 10 mg via ORAL
  Filled 2020-08-02: qty 2

## 2020-08-02 MED ORDER — CLONIDINE HCL 0.2 MG PO TABS: 0.2000 mg | ORAL_TABLET | Freq: Once | ORAL | Status: DC

## 2020-08-02 MED ORDER — BRIMONIDINE TARTRATE 0.2 % OP SOLN
1.0000 [drp] | Freq: Once | OPHTHALMIC | Status: AC
  Administered 2020-08-02: 1 [drp] via OPHTHALMIC
  Filled 2020-08-02: qty 5

## 2020-08-02 MED ORDER — TIMOLOL MALEATE 0.5 % OP SOLN
1.0000 [drp] | Freq: Once | OPHTHALMIC | Status: AC
  Administered 2020-08-02: 1 [drp] via OPHTHALMIC
  Filled 2020-08-02: qty 5

## 2020-08-02 MED ORDER — APRACLONIDINE HCL 1 % OP SOLN
1.0000 [drp] | Freq: Once | OPHTHALMIC | Status: DC
  Filled 2020-08-02: qty 2.4

## 2020-08-02 MED ORDER — HYDRALAZINE HCL 25 MG PO TABS: 25.0000 mg | ORAL_TABLET | Freq: Three times a day (TID) | ORAL | 0 refills | Status: DC

## 2020-08-02 MED ORDER — ACETAZOLAMIDE 250 MG PO TABS
500.0000 mg | ORAL_TABLET | Freq: Once | ORAL | Status: AC
  Administered 2020-08-02: 500 mg via ORAL
  Filled 2020-08-02 (×2): qty 2

## 2020-08-02 MED ORDER — TETRACAINE HCL 0.5 % OP SOLN
2.0000 [drp] | Freq: Once | OPHTHALMIC | Status: AC
  Administered 2020-08-02: 2 [drp] via OPHTHALMIC
  Filled 2020-08-02: qty 4

## 2020-08-02 MED ORDER — HYDRALAZINE HCL 25 MG PO TABS
50.0000 mg | ORAL_TABLET | Freq: Once | ORAL | Status: AC
  Administered 2020-08-02: 50 mg via ORAL
  Filled 2020-08-02: qty 2

## 2020-08-02 MED ORDER — PILOCARPINE HCL 2 % OP SOLN
1.0000 [drp] | Freq: Once | OPHTHALMIC | Status: AC
  Administered 2020-08-02: 1 [drp] via OPHTHALMIC
  Filled 2020-08-02: qty 15

## 2020-08-02 MED ORDER — CLONIDINE HCL 0.1 MG PO TABS
0.1000 mg | ORAL_TABLET | Freq: Once | ORAL | Status: AC
  Administered 2020-08-02: 0.1 mg via ORAL
  Filled 2020-08-02: qty 1

## 2020-08-02 MED ORDER — HYDROCODONE-ACETAMINOPHEN 5-325 MG PO TABS
1.0000 | ORAL_TABLET | Freq: Once | ORAL | Status: AC
  Administered 2020-08-02: 1 via ORAL
  Filled 2020-08-02: qty 1

## 2020-08-02 NOTE — ED Triage Notes (Signed)
Pt c/o left eye drainage and redness x 2 weeks.

## 2020-08-02 NOTE — ED Notes (Signed)
Pt states that he took his PO HTN medication about 0400 this morning.

## 2020-08-02 NOTE — ED Notes (Signed)
Ophthalmology paged to MD Horton.

## 2020-08-02 NOTE — ED Provider Notes (Signed)
Patient signed out to me from overnight shift.  Patient given multiple eyedrops including timolol pilocarpine and Alphagan as well as oral acetazolamide.  However pressures and affected eye remain too high to read.  Visual acuity is only to shadows in that eye.  20/25 in the normal lie.  Case discussed with ophthalmology on-call Dr.Tanner who will see the patient in his office today.  Patient noted to be hypertensive again.  Given his regular blood pressure medication as well as hydralazine and clonidine with improvement of pressure.  Patient otherwise has no headache or other focal neuro deficit. Will advise him to go to ophthalmology clinic today.  Advised immediate return if he has any additional complications or concerns.    Cheryll Cockayne, MD 08/02/20 [phone]

## 2020-08-02 NOTE — ED Notes (Signed)
Ophthalmology paged again to MD Horton phone.

## 2020-08-02 NOTE — Discharge Instructions (Signed)
Go directly to the ophthalmologist office.  Return to the ER if you have additional complications or concerns.  You will need to follow-up with your primary care doctor again within the week to continue to manage her blood pressure which appears high today.
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
## 2021-04-20 ENCOUNTER — Encounter (HOSPITAL_COMMUNITY): Payer: Self-pay | Admitting: Emergency Medicine

## 2021-04-20 ENCOUNTER — Emergency Department (HOSPITAL_COMMUNITY): Payer: PRIVATE HEALTH INSURANCE

## 2021-04-20 ENCOUNTER — Emergency Department (HOSPITAL_COMMUNITY)
Admission: EM | Admit: 2021-04-20 | Discharge: 2021-04-20 | Disposition: A | Discharge status: Home / Self Care | Payer: PRIVATE HEALTH INSURANCE | Attending: Emergency Medicine | Admitting: Emergency Medicine

## 2021-04-20 DIAGNOSIS — F17210 Nicotine dependence, cigarettes, uncomplicated: Secondary | ICD-10-CM | POA: Insufficient documentation

## 2021-04-20 DIAGNOSIS — Z79899 Other long term (current) drug therapy: Secondary | ICD-10-CM | POA: Insufficient documentation

## 2021-04-20 DIAGNOSIS — I10 Essential (primary) hypertension: Secondary | ICD-10-CM | POA: Insufficient documentation

## 2021-04-20 DIAGNOSIS — Z20822 Contact with and (suspected) exposure to covid-19: Secondary | ICD-10-CM | POA: Insufficient documentation

## 2021-04-20 LAB — URINALYSIS, ROUTINE W REFLEX MICROSCOPIC
Bilirubin Urine: NEGATIVE
Glucose, UA: NEGATIVE mg/dL
Ketones, ur: NEGATIVE mg/dL
Leukocytes,Ua: NEGATIVE
Nitrite: NEGATIVE
Protein, ur: 100 mg/dL — AB
Specific Gravity, Urine: 1.015 (ref 1.005–1.030)
pH: 6 (ref 5.0–8.0)

## 2021-04-20 LAB — CBC WITH DIFFERENTIAL/PLATELET
Abs Immature Granulocytes: 0.01 10*3/uL (ref 0.00–0.07)
Basophils Absolute: 0 10*3/uL (ref 0.0–0.1)
Basophils Relative: 0 %
Eosinophils Absolute: 0.1 10*3/uL (ref 0.0–0.5)
Eosinophils Relative: 2 %
HCT: 45.3 % (ref 39.0–52.0)
Hemoglobin: 14.9 g/dL (ref 13.0–17.0)
Immature Granulocytes: 0 %
Lymphocytes Relative: 36 %
Lymphs Abs: 2.2 10*3/uL (ref 0.7–4.0)
MCH: 29.6 pg (ref 26.0–34.0)
MCHC: 32.9 g/dL (ref 30.0–36.0)
MCV: 89.9 fL (ref 80.0–100.0)
Monocytes Absolute: 0.7 10*3/uL (ref 0.1–1.0)
Monocytes Relative: 11 %
Neutro Abs: 3.1 10*3/uL (ref 1.7–7.7)
Neutrophils Relative %: 51 %
Platelets: 177 10*3/uL (ref 150–400)
RBC: 5.04 MIL/uL (ref 4.22–5.81)
RDW: 13.2 % (ref 11.5–15.5)
WBC: 6.1 10*3/uL (ref 4.0–10.5)
nRBC: 0 % (ref 0.0–0.2)

## 2021-04-20 LAB — COMPREHENSIVE METABOLIC PANEL
ALT: 28 U/L (ref 0–44)
AST: 36 U/L (ref 15–41)
Albumin: 3.9 g/dL (ref 3.5–5.0)
Alkaline Phosphatase: 59 U/L (ref 38–126)
Anion gap: 9 (ref 5–15)
BUN: 17 mg/dL (ref 8–23)
CO2: 24 mmol/L (ref 22–32)
Calcium: 8.7 mg/dL — ABNORMAL LOW (ref 8.9–10.3)
Chloride: 100 mmol/L (ref 98–111)
Creatinine, Ser: 1.03 mg/dL (ref 0.61–1.24)
GFR, Estimated: >60 mL/min (ref >60)
Glucose, Bld: 76 mg/dL (ref 70–99)
Potassium: 3.3 mmol/L — ABNORMAL LOW (ref 3.5–5.1)
Sodium: 133 mmol/L — ABNORMAL LOW (ref 135–145)
Total Bilirubin: 1.2 mg/dL (ref 0.3–1.2)
Total Protein: 8.3 g/dL — ABNORMAL HIGH (ref 6.5–8.1)

## 2021-04-20 LAB — RAPID URINE DRUG SCREEN, HOSP PERFORMED
Amphetamines: NOT DETECTED
Barbiturates: NOT DETECTED
Benzodiazepines: NOT DETECTED
Cocaine: NOT DETECTED
Opiates: NOT DETECTED
Tetrahydrocannabinol: NOT DETECTED

## 2021-04-20 LAB — URINALYSIS, MICROSCOPIC (REFLEX): Squamous Epithelial / HPF: NONE SEEN (ref 0–5)

## 2021-04-20 LAB — ETHANOL: Alcohol, Ethyl (B): 27 mg/dL — ABNORMAL HIGH (ref <10)

## 2021-04-20 LAB — TROPONIN I (HIGH SENSITIVITY)
Troponin I (High Sensitivity): 24 ng/L — ABNORMAL HIGH (ref <18)
Troponin I (High Sensitivity): 23 ng/L — ABNORMAL HIGH (ref <18)

## 2021-04-20 LAB — RESP PANEL BY RT-PCR (FLU A&B, COVID) ARPGX2
Influenza A by PCR: NEGATIVE
Influenza B by PCR: NEGATIVE
SARS Coronavirus 2 by RT PCR: NEGATIVE

## 2021-04-20 MED ORDER — HYDRALAZINE HCL 25 MG PO TABS
50.0000 mg | ORAL_TABLET | Freq: Once | ORAL | Status: AC
  Administered 2021-04-20: 50 mg via ORAL
  Filled 2021-04-20: qty 2

## 2021-04-20 MED ORDER — HYDRALAZINE HCL 25 MG PO TABS: 25.0000 mg | ORAL_TABLET | Freq: Three times a day (TID) | ORAL | 2 refills | Status: DC

## 2021-04-20 MED ORDER — LABETALOL HCL 5 MG/ML IV SOLN
20.0000 mg | INTRAVENOUS | Status: DC | PRN
  Administered 2021-04-20 (×2): 20 mg via INTRAVENOUS
  Filled 2021-04-20 (×3): qty 4

## 2021-04-20 MED ORDER — AMLODIPINE BESYLATE 10 MG PO TABS: 10.0000 mg | ORAL_TABLET | Freq: Every day | ORAL | 2 refills | Status: DC

## 2021-04-20 MED ORDER — SODIUM CHLORIDE 0.9 % IV SOLN: INTRAVENOUS | Status: DC

## 2021-04-20 MED ORDER — AMLODIPINE BESYLATE 5 MG PO TABS
10.0000 mg | ORAL_TABLET | Freq: Once | ORAL | Status: AC
  Administered 2021-04-20: 10 mg via ORAL
  Filled 2021-04-20: qty 2

## 2021-04-20 NOTE — ED Triage Notes (Signed)
Pt here from home via ems with c/o htn , and possible seizure like activity , pt alert and oriented on arrival with no complaints, pt has not had any of his meds in months
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
## 2021-04-20 NOTE — Discharge Instructions (Signed)
Stay on a low-salt diet.  Start the blood pressure medicine, prescriptions, you were given, tomorrow.  Use the resource guide to help you find a doctor for a checkup in a week or 2.  Return here if needed.

## 2021-06-21 ENCOUNTER — Encounter (HOSPITAL_COMMUNITY): Payer: Self-pay | Admitting: Emergency Medicine

## 2021-06-21 ENCOUNTER — Emergency Department (HOSPITAL_COMMUNITY)
Admission: EM | Admit: 2021-06-21 | Discharge: 2021-06-22 | Disposition: A | Discharge status: Home / Self Care | Payer: Self-pay | Attending: Emergency Medicine | Admitting: Emergency Medicine

## 2021-06-21 ENCOUNTER — Other Ambulatory Visit: Payer: Self-pay

## 2021-06-21 DIAGNOSIS — R1031 Right lower quadrant pain: Secondary | ICD-10-CM | POA: Insufficient documentation

## 2021-06-21 DIAGNOSIS — Z79899 Other long term (current) drug therapy: Secondary | ICD-10-CM | POA: Insufficient documentation

## 2021-06-21 DIAGNOSIS — R1032 Left lower quadrant pain: Secondary | ICD-10-CM | POA: Insufficient documentation

## 2021-06-21 DIAGNOSIS — I10 Essential (primary) hypertension: Secondary | ICD-10-CM | POA: Insufficient documentation

## 2021-06-21 DIAGNOSIS — R1084 Generalized abdominal pain: Secondary | ICD-10-CM

## 2021-06-21 MED ORDER — ONDANSETRON HCL 4 MG/2ML IJ SOLN
4.0000 mg | Freq: Once | INTRAMUSCULAR | Status: AC
  Administered 2021-06-22: 4 mg via INTRAVENOUS
  Filled 2021-06-21: qty 2

## 2021-06-21 MED ORDER — SODIUM CHLORIDE 0.9 % IV BOLUS
1000.0000 mL | Freq: Once | INTRAVENOUS | Status: AC
  Administered 2021-06-22: 1000 mL via INTRAVENOUS

## 2021-06-21 MED ORDER — MORPHINE SULFATE (PF) 4 MG/ML IV SOLN
4.0000 mg | Freq: Once | INTRAVENOUS | Status: AC
  Administered 2021-06-22: 4 mg via INTRAVENOUS
  Filled 2021-06-21: qty 1

## 2021-06-21 NOTE — ED Triage Notes (Signed)
Pt from home. C/o lower abdominal pain that started 2x days ago. Pt has been vomiting; last time vomited was 7 hrs ago. Denies diarrhea/fever.

## 2021-06-21 NOTE — ED Provider Notes (Signed)
[facility] EMERGENCY DEPARTMENT Provider Note   CSN: [id number] Arrival date & time: 06/21/21  2252     History  Chief Complaint  Patient presents with   Abdominal Pain    Peter Meyer is a 61 y.o. male.  Patient is a 61 year old male with past medical history of hypertension.  Patient presenting today for evaluation of abdominal pain and vomiting.  He reports multiple episodes of vomiting over the past 2 days and states he has had nothing to eat or drink over this time period.  He denies any fevers or chills.  He denies constipation or diarrhea.  The history is provided by the patient.  Abdominal Pain Pain location:  LLQ, RLQ and suprapubic Pain quality: cramping   Pain radiates to:  Does not radiate Pain severity:  Moderate Onset quality:  Gradual Duration:  2 days Timing:  Constant Progression:  Worsening Chronicity:  New Relieved by:  Nothing Worsened by:  Nothing     Home Medications Prior to Admission medications   Medication Sig Start Date End Date Taking? Authorizing Provider  amLODipine (NORVASC) 10 MG tablet Take 1 tablet (10 mg total) by mouth daily. 04/20/21   Mancel Bale, MD  cyclobenzaprine (FLEXERIL) 10 MG tablet Take 1 tablet (10 mg total) by mouth 3 (three) times daily as needed for muscle spasms. 05/23/20   Pollyann Savoy, MD  hydrALAZINE (APRESOLINE) 25 MG tablet Take 1 tablet (25 mg total) by mouth 3 (three) times daily. 04/20/21 07/19/21  Mancel Bale, MD      Allergies    Patient has no known allergies.    Review of Systems   Review of Systems  Gastrointestinal:  Positive for abdominal pain.  All other systems reviewed and are negative.  Physical Exam Updated Vital Signs BP (!) 207/116 (BP Location: Left Arm)    Pulse 75    Temp 98.3 F (36.8 C) (Oral)    Resp 16    Ht 6' (1.829 m)    Wt 86.2 kg    SpO2 100%    BMI 25.77 kg/m  Physical Exam Vitals and nursing note reviewed.  Constitutional:      General: He is not in acute  distress.    Appearance: He is well-developed. He is not diaphoretic.  HENT:     Head: Normocephalic and atraumatic.  Cardiovascular:     Rate and Rhythm: Normal rate and regular rhythm.     Heart sounds: No murmur heard.   No friction rub.  Pulmonary:     Effort: Pulmonary effort is normal. No respiratory distress.     Breath sounds: Normal breath sounds. No wheezing or rales.  Abdominal:     General: Bowel sounds are normal. There is no distension.     Palpations: Abdomen is soft.     Tenderness: There is abdominal tenderness in the right lower quadrant, suprapubic area and left lower quadrant. There is no right CVA tenderness, left CVA tenderness, guarding or rebound.  Musculoskeletal:        General: Normal range of motion.     Cervical back: Normal range of motion and neck supple.  Skin:    General: Skin is warm and dry.  Neurological:     Mental Status: He is alert and oriented to person, place, and time.     Coordination: Coordination normal.    ED Results / Procedures / Treatments   Labs (all labs ordered are listed, but only abnormal results are displayed) Labs Reviewed -  No data to display  EKG None  Radiology No results found.  Procedures Procedures    Medications Ordered in ED Medications  sodium chloride 0.9 % bolus 1,000 mL (has no administration in time range)  ondansetron (ZOFRAN) injection 4 mg (has no administration in time range)  morphine (PF) 4 MG/ML injection 4 mg (has no administration in time range)    ED Course/ Medical Decision Making/ A&P  This patient presents to the ED for concern of abdominal pain, this involves an extensive number of treatment options, and is a complaint that carries with it a high risk of complications and morbidity.  The differential diagnosis includes acute diverticulitis, urinary tract infection, appendicitis, small bowel obstruction   Co morbidities that complicate the patient evaluation  None   Additional  history obtained:  No additional history or outside records necessary   Lab Tests:  I Ordered, and personally interpreted labs.  The pertinent results include: CBC, metabolic panel, LFTs, and lipase, all of which are unremarkable   Imaging Studies ordered:  I ordered imaging studies including CT scan of the abdomen and pelvis I independently visualized and interpreted imaging which showed no acute intra-abdominal process I agree with the radiologist interpretation   Cardiac Monitoring:  No cardiac monitoring performed   Medicines ordered and prescription drug management:  I ordered medication including morphine for pain  Reevaluation of the patient after these medicines showed that the patient improved I have reviewed the patients home medicines and have made adjustments as needed   Test Considered:  No other test considered were indicated   Critical Interventions:  Intravenous pain medication and fluids   Consultations Obtained:  No consultations obtained or indicated   Problem List / ED Course:  Patient presenting with complaints of lower abdominal pain as described in the HPI.  He describes vomiting over the past 2 days and little p.o. intake.  Work-up initiated including laboratory studies and CT scan.  These were all essentially unremarkable.  He was given medicine for pain and nausea and is now requesting food.  He is now tolerating a sandwich and chips without difficulty.  At this point, patient seems appropriate for discharge with as needed return.   Reevaluation:  After the interventions noted above, I reevaluated the patient and found that they have : Improved   Social Determinants of Health:  None   Dispostion:  After consideration of the diagnostic results and the patients response to treatment, I feel that the patent would benefit from discharge home with Zofran and follow-up as needed.    Final Clinical Impression(s) / ED Diagnoses Final  diagnoses:  None    Rx / DC Orders ED Discharge Orders     None         Geoffery Lyons, MD 06/22/21 [id number]

## 2021-06-21 NOTE — ED Notes (Signed)
Edp made aware of hypertension.

## 2021-06-22 ENCOUNTER — Emergency Department (HOSPITAL_COMMUNITY)
Admission: EM | Admit: 2021-06-22 | Discharge: 2021-06-22 | Disposition: A | Discharge status: Home / Self Care | Payer: Self-pay | Attending: Emergency Medicine | Admitting: Emergency Medicine

## 2021-06-22 ENCOUNTER — Encounter (HOSPITAL_COMMUNITY): Payer: Self-pay | Admitting: *Deleted

## 2021-06-22 ENCOUNTER — Emergency Department (HOSPITAL_COMMUNITY): Payer: Self-pay

## 2021-06-22 DIAGNOSIS — R112 Nausea with vomiting, unspecified: Secondary | ICD-10-CM | POA: Insufficient documentation

## 2021-06-22 DIAGNOSIS — I10 Essential (primary) hypertension: Secondary | ICD-10-CM | POA: Insufficient documentation

## 2021-06-22 DIAGNOSIS — R109 Unspecified abdominal pain: Secondary | ICD-10-CM | POA: Insufficient documentation

## 2021-06-22 DIAGNOSIS — Z79899 Other long term (current) drug therapy: Secondary | ICD-10-CM | POA: Insufficient documentation

## 2021-06-22 LAB — CBC WITH DIFFERENTIAL/PLATELET
Abs Immature Granulocytes: 0.01 10*3/uL (ref 0.00–0.07)
Basophils Absolute: 0 10*3/uL (ref 0.0–0.1)
Basophils Relative: 1 %
Eosinophils Absolute: 0.1 10*3/uL (ref 0.0–0.5)
Eosinophils Relative: 2 %
HCT: 39.4 % (ref 39.0–52.0)
Hemoglobin: 12.7 g/dL — ABNORMAL LOW (ref 13.0–17.0)
Immature Granulocytes: 0 %
Lymphocytes Relative: 42 %
Lymphs Abs: 2.5 10*3/uL (ref 0.7–4.0)
MCH: 28.8 pg (ref 26.0–34.0)
MCHC: 32.2 g/dL (ref 30.0–36.0)
MCV: 89.3 fL (ref 80.0–100.0)
Monocytes Absolute: 0.5 10*3/uL (ref 0.1–1.0)
Monocytes Relative: 8 %
Neutro Abs: 2.8 10*3/uL (ref 1.7–7.7)
Neutrophils Relative %: 47 %
Platelets: 199 10*3/uL (ref 150–400)
RBC: 4.41 MIL/uL (ref 4.22–5.81)
RDW: 13.4 % (ref 11.5–15.5)
WBC: 5.9 10*3/uL (ref 4.0–10.5)
nRBC: 0 % (ref 0.0–0.2)

## 2021-06-22 LAB — URINALYSIS, ROUTINE W REFLEX MICROSCOPIC
Bilirubin Urine: NEGATIVE
Glucose, UA: NEGATIVE mg/dL
Hgb urine dipstick: NEGATIVE
Ketones, ur: NEGATIVE mg/dL
Leukocytes,Ua: NEGATIVE
Nitrite: NEGATIVE
Protein, ur: 100 mg/dL — AB
Specific Gravity, Urine: 1.02 (ref 1.005–1.030)
pH: 6 (ref 5.0–8.0)

## 2021-06-22 LAB — COMPREHENSIVE METABOLIC PANEL
ALT: 14 U/L (ref 0–44)
AST: 18 U/L (ref 15–41)
Albumin: 3.6 g/dL (ref 3.5–5.0)
Alkaline Phosphatase: 49 U/L (ref 38–126)
Anion gap: 6 (ref 5–15)
BUN: 14 mg/dL (ref 8–23)
CO2: 26 mmol/L (ref 22–32)
Calcium: 8.6 mg/dL — ABNORMAL LOW (ref 8.9–10.3)
Chloride: 102 mmol/L (ref 98–111)
Creatinine, Ser: 1.08 mg/dL (ref 0.61–1.24)
GFR, Estimated: >60 mL/min (ref >60)
Glucose, Bld: 99 mg/dL (ref 70–99)
Potassium: 4.3 mmol/L (ref 3.5–5.1)
Sodium: 134 mmol/L — ABNORMAL LOW (ref 135–145)
Total Bilirubin: 0.6 mg/dL (ref 0.3–1.2)
Total Protein: 7.5 g/dL (ref 6.5–8.1)

## 2021-06-22 LAB — URINALYSIS, MICROSCOPIC (REFLEX)

## 2021-06-22 LAB — LIPASE, BLOOD: Lipase: 54 U/L — ABNORMAL HIGH (ref 11–51)

## 2021-06-22 MED ORDER — ONDANSETRON 8 MG PO TBDP: ORAL_TABLET | ORAL | 0 refills | Status: DC

## 2021-06-22 MED ORDER — AMLODIPINE BESYLATE 10 MG PO TABS: 10.0000 mg | ORAL_TABLET | Freq: Every day | ORAL | 2 refills | Status: DC

## 2021-06-22 MED ORDER — AMLODIPINE BESYLATE 5 MG PO TABS
10.0000 mg | ORAL_TABLET | Freq: Once | ORAL | Status: AC
  Administered 2021-06-22: 10 mg via ORAL
  Filled 2021-06-22: qty 2

## 2021-06-22 MED ORDER — HYDRALAZINE HCL 25 MG PO TABS
25.0000 mg | ORAL_TABLET | Freq: Once | ORAL | Status: AC
  Administered 2021-06-22: 25 mg via ORAL
  Filled 2021-06-22: qty 1

## 2021-06-22 MED ORDER — HYDRALAZINE HCL 25 MG PO TABS: 25.0000 mg | ORAL_TABLET | Freq: Three times a day (TID) | ORAL | 2 refills | Status: DC

## 2021-06-22 MED ORDER — IOHEXOL 300 MG/ML  SOLN
100.0000 mL | Freq: Once | INTRAMUSCULAR | Status: AC | PRN
  Administered 2021-06-22: 100 mL via INTRAVENOUS

## 2021-06-22 MED ORDER — ONDANSETRON 8 MG PO TBDP: 8.0000 mg | ORAL_TABLET | Freq: Three times a day (TID) | ORAL | 0 refills | Status: DC | PRN

## 2021-06-22 MED ORDER — ONDANSETRON 8 MG PO TBDP
8.0000 mg | ORAL_TABLET | Freq: Once | ORAL | Status: AC
  Administered 2021-06-22: 8 mg via ORAL
  Filled 2021-06-22: qty 1

## 2021-06-22 NOTE — ED Triage Notes (Signed)
Pt states he vomited again while waiting in the waiting room.  Pt states he does not have anywhere to go.  Pt wanting a room.

## 2021-06-22 NOTE — ED Notes (Signed)
Pt given sprite and a bag lunch to eat

## 2021-06-22 NOTE — Discharge Instructions (Signed)
Begin taking Zofran as prescribed as needed for nausea.  Resume taking your blood pressure medications as previously prescribed.  Refills of these medications have been provided here in the emergency department.  Follow-up with your primary doctor in the next 1 to 2 weeks, and return to the ER if symptoms significantly worsen or change.

## 2021-06-22 NOTE — ED Notes (Signed)
Patient transported to CT 

## 2021-06-22 NOTE — ED Notes (Signed)
Pt given meal tray, juice, soda, and blood pressure medications.

## 2021-06-22 NOTE — ED Notes (Signed)
Med transfer sheet done in error.

## 2021-06-22 NOTE — ED Provider Notes (Signed)
[facility] EMERGENCY DEPARTMENT Provider Note   CSN: [id number] Arrival date & time: 06/22/21  1448     History  Chief Complaint  Patient presents with   Emesis    Peter Meyer is a 61 y.o. male.   Emesis  Patient with past medical history of hypertension presents today with nausea and 1 episode of vomiting.  Was seen earlier today at the hospital for abdominal pain, states he has been doing well since then but "has nowhere to go".    Patient states she is hungry and requesting food.   Does endorse not taking any of his home medicine for "some time".  When asked where patient has been living he states with his family but "they are mean."  Home Medications Prior to Admission medications   Medication Sig Start Date End Date Taking? Authorizing Provider  amLODipine (NORVASC) 10 MG tablet Take 1 tablet (10 mg total) by mouth daily. 06/22/21   Geoffery Lyons, MD  cyclobenzaprine (FLEXERIL) 10 MG tablet Take 1 tablet (10 mg total) by mouth 3 (three) times daily as needed for muscle spasms. 05/23/20   Pollyann Savoy, MD  hydrALAZINE (APRESOLINE) 25 MG tablet Take 1 tablet (25 mg total) by mouth 3 (three) times daily. 06/22/21 09/20/21  Geoffery Lyons, MD  ondansetron (ZOFRAN-ODT) 8 MG disintegrating tablet 8mg  ODT q4 hours prn nausea 06/22/21   08/20/21, MD      Allergies    Patient has no known allergies.    Review of Systems   Review of Systems  Gastrointestinal:  Positive for vomiting.   Physical Exam Updated Vital Signs BP (!) 190/108 (BP Location: Right Arm)    Pulse 65    Temp 98.1 F (36.7 C) (Oral)    Resp 16    SpO2 100%  Physical Exam Vitals and nursing note reviewed. Exam conducted with a chaperone present.  Constitutional:      Appearance: Normal appearance.  HENT:     Head: Normocephalic and atraumatic.  Eyes:     General: No scleral icterus.       Right eye: No discharge.        Left eye: No discharge.     Extraocular Movements: Extraocular movements  intact.     Pupils: Pupils are equal, round, and reactive to light.  Cardiovascular:     Rate and Rhythm: Normal rate and regular rhythm.     Pulses: Normal pulses.     Heart sounds: Normal heart sounds. No murmur heard.   No friction rub. No gallop.  Pulmonary:     Effort: Pulmonary effort is normal. No respiratory distress.     Breath sounds: Normal breath sounds.  Abdominal:     General: Abdomen is flat. Bowel sounds are normal. There is no distension.     Palpations: Abdomen is soft.     Tenderness: There is abdominal tenderness. There is no guarding or rebound.  Skin:    General: Skin is warm and dry.     Coloration: Skin is not jaundiced.  Neurological:     Mental Status: He is alert. Mental status is at baseline.     Coordination: Coordination normal.    ED Results / Procedures / Treatments   Labs (all labs ordered are listed, but only abnormal results are displayed) Labs Reviewed - No data to display  EKG None  Radiology CT ABDOMEN PELVIS W CONTRAST  Result Date: 06/22/2021 CLINICAL DATA:  Lower abdominal pain, vomiting EXAM: CT ABDOMEN AND  PELVIS WITH CONTRAST TECHNIQUE: Multidetector CT imaging of the abdomen and pelvis was performed using the standard protocol following bolus administration of intravenous contrast. RADIATION DOSE REDUCTION: This exam was performed according to the departmental dose-optimization program which includes automated exposure control, adjustment of the mA and/or kV according to patient size and/or use of iterative reconstruction technique. CONTRAST:  OMNIPAQUE IOHEXOL 300 MG/ML  SOLN COMPARISON:  None. FINDINGS: Lower chest: No acute abnormality. Hepatobiliary: No focal liver abnormality is seen. No gallstones, gallbladder wall thickening, or biliary dilatation. Multiple calcified lymph nodes within the porta hepatis likely relate to old granulomatous disease. Pancreas: Unremarkable Spleen: Unremarkable Adrenals/Urinary Tract: Adrenal glands  are unremarkable. Kidneys are normal, without renal calculi, focal lesion, or hydronephrosis. Bladder is unremarkable. Stomach/Bowel: Stomach is within normal limits. Appendix appears normal. No evidence of bowel wall thickening, distention, or inflammatory changes. No free intraperitoneal gas. Vascular/Lymphatic: Aortic atherosclerosis. No enlarged abdominal or pelvic lymph nodes. Reproductive: Prostate is unremarkable. Other: Tiny fat containing left inguinal hernia. No abdominopelvic ascites. Musculoskeletal: Degenerative changes are seen within the lumbar spine. No lytic or blastic bone lesions. No acute bone abnormality. IMPRESSION: No acute intra-abdominal pathology identified. No definite radiographic explanation for the patient's reported symptoms. Aortic Atherosclerosis (ICD10-I70.0). Electronically Signed   By: Helyn Numbers M.D.   On: 06/22/2021 01:50    Procedures Procedures    Medications Ordered in ED Medications  amLODipine (NORVASC) tablet 10 mg (has no administration in time range)  hydrALAZINE (APRESOLINE) tablet 25 mg (has no administration in time range)  ondansetron (ZOFRAN-ODT) disintegrating tablet 8 mg (8 mg Oral Given 06/22/21 1512)    ED Course/ Medical Decision Making/ A&P                           Medical Decision Making Risk Prescription drug management.   This patient presents to the ED for concern of emesis and abdominal pain, this involves an extensive number of treatment options, and is a complaint that carries with it a high risk of complications and morbidity.  The differential diagnosis includes Pilo, UTI, pancreatitis, other   Co morbidities that complicate the patient evaluation: His current unstable housing is limitation to obtaining care.   Additional history obtained: -Additional history obtained from chart review.  I personally reviewed the patient's ED visit yesterday including his laboratory work-up which was unremarkable as well as a CT abdomen  which did not show any acute process. -External records from outside source obtained and reviewed including: Chart review including previous notes, labs, imaging, consultation notes   Lab Tests: -I ordered, reviewed, and interpreted labs.  The pertinent results include:  none    Medicines ordered and prescription drug management: -I ordered medication including zofran  for nausea  -Reevaluation of the patient after these medicines showed that the patient improved -I have reviewed the patients home medicines and have made adjustments as needed   ED Course: Patient is hypertensive, home meds given. no peritoneal signs on abdominal exam.  Given his recent work-up earlier this morning including CT abdomen and labs I do not feel the need to repeat them today.  Patient requesting food, tolerated a sandwich without any recurrent vomiting.  Will give nausea medicine, I also put in a consult for transition of care to help establish the patient with primary care.  Resources given regarding shelters and transportation.  Discharged in stable condition.   Reevaluation: After the interventions noted above, I reevaluated the patient  and found that they have :improved   Dispostion: D/C         Final Clinical Impression(s) / ED Diagnoses Final diagnoses:  None    Rx / DC Orders ED Discharge Orders     None         Theron AristaSage, Parthena Fergeson, PA-C 06/22/21 1833    Maia PlanLong, Joshua G, MD 07/03/21 [phone]

## 2021-06-22 NOTE — Discharge Instructions (Signed)
Take your blood pressure medicine at home.  I prescribed you Zofran you can take for any nausea.  Information attached for shelters as well as transportation guide.

## 2021-06-28 ENCOUNTER — Other Ambulatory Visit: Payer: Self-pay

## 2021-06-28 ENCOUNTER — Encounter (HOSPITAL_COMMUNITY): Payer: Self-pay | Admitting: *Deleted

## 2021-06-28 DIAGNOSIS — R109 Unspecified abdominal pain: Secondary | ICD-10-CM | POA: Insufficient documentation

## 2021-06-28 DIAGNOSIS — I10 Essential (primary) hypertension: Secondary | ICD-10-CM | POA: Insufficient documentation

## 2021-06-28 DIAGNOSIS — F17210 Nicotine dependence, cigarettes, uncomplicated: Secondary | ICD-10-CM | POA: Insufficient documentation

## 2021-06-28 NOTE — ED Triage Notes (Signed)
Pt with c/o abd pain.  Pt arrived via [facility] and pt was hypertensive.  Pt states he is homeless. Pt denies N/V/D

## 2021-06-29 ENCOUNTER — Emergency Department (HOSPITAL_COMMUNITY)
Admission: EM | Admit: 2021-06-29 | Discharge: 2021-06-29 | Disposition: A | Discharge status: Home / Self Care | Payer: Self-pay | Attending: Emergency Medicine | Admitting: Emergency Medicine

## 2021-06-29 DIAGNOSIS — R109 Unspecified abdominal pain: Secondary | ICD-10-CM

## 2021-06-29 DIAGNOSIS — I10 Essential (primary) hypertension: Secondary | ICD-10-CM

## 2021-06-29 MED ORDER — AMLODIPINE BESYLATE 5 MG PO TABS
10.0000 mg | ORAL_TABLET | Freq: Once | ORAL | Status: AC
  Administered 2021-06-29: 10 mg via ORAL
  Filled 2021-06-29: qty 2

## 2021-06-29 NOTE — ED Provider Notes (Signed)
[facility]-EMERGENCY DEPT [facility] Emergency Department Provider Note MRN:  [id number]  Arrival date & time: 06/29/21     Chief Complaint   Abdominal Pain   History of Present Illness   Peter Meyer is a 61 y.o. year-old male with a history of hypertension presenting to the ED with chief complaint of abdominal pain.  Had some diffuse mild abdominal discomfort earlier today which has resolved.  He is mostly here to talk about his blood pressure being high.  Has not taken his blood pressure medicines in a few months.  Denies dizziness or diaphoresis, no headache or vision change, no chest pain or shortness of breath, currently without any pain or symptoms, feels normal.  Review of Systems  A thorough review of systems was obtained and all systems are negative except as noted in the HPI and PMH.   Patient's Health History    Past Medical History:  Diagnosis Date   Hypertension     History reviewed. No pertinent surgical history.  History reviewed. No pertinent family history.  Social History   Socioeconomic History   Marital status: Legally Separated    Spouse name: Not on file   Number of children: Not on file   Years of education: Not on file   Highest education level: Not on file  Occupational History   Not on file  Tobacco Use   Smoking status: Every Day    Packs/day: 1.00    Types: Cigarettes   Smokeless tobacco: Never  Substance and Sexual Activity   Alcohol use: Yes    Alcohol/week: 4.0 standard drinks    Types: 4 Cans of beer per week    Comment: weekends   Drug use: No   Sexual activity: Not on file  Other Topics Concern   Not on file  Social History Narrative   Not on file   Social Determinants of Health   Financial Resource Strain: Not on file  Food Insecurity: Not on file  Transportation Needs: Not on file  Physical Activity: Not on file  Stress: Not on file  Social Connections: Not on file  Intimate Partner Violence: Not on file      Physical Exam   Vitals:   06/29/21 0200 06/29/21 0230  BP: (!) 216/115 (!) 212/107  Pulse: 70 66  Resp: 17   Temp:    SpO2: 98% 98%    CONSTITUTIONAL: Well-appearing, NAD NEURO/PSYCH:  Alert and oriented x 3, no focal deficits EYES:  eyes equal and reactive ENT/NECK:  no LAD, no JVD CARDIO: Regular rate, well-perfused, normal S1 and S2 PULM:  CTAB no wheezing or rhonchi GI/GU:  non-distended, non-tender MSK/SPINE:  No gross deformities, no edema SKIN:  no rash, atraumatic   *Additional and/or pertinent findings included in MDM below  Diagnostic and Interventional Summary    EKG Interpretation  Date/Time:    Ventricular Rate:    PR Interval:    QRS Duration:   QT Interval:    QTC Calculation:   R Axis:     Text Interpretation:         Labs Reviewed - No data to display  No orders to display    Medications  amLODipine (NORVASC) tablet 10 mg (10 mg Oral Given 06/29/21 0202)     Procedures  /  Critical Care Procedures  ED Course and Medical Decision Making  Initial Impression and Ddx Abdomen completely soft and nontender and pain has resolved.  No other concerning symptoms.  Patient does have significantly  elevated blood pressure, but seems to be a more chronic issue when we look at prior blood pressures on record.  Has been noncompliant with medication.  We discussed the risks of long-term blood pressure in this range, we specifically talked about the risk of stroke and heart attack.  He agrees to fill his prescriptions tomorrow.  Provided him with a dose of his home amlodipine and his blood pressure is downtrending.  Continues to look and feel well with no symptoms, resting comfortably.  Appropriate for discharge.  Past medical/surgical history that increases complexity of ED encounter: Hypertension  Interpretation of Diagnostics Not applicable  Patient Reassessment and Ultimate Disposition/Management Discharge  Patient management required discussion with  the following services or consulting groups:  None  Complexity of Problems Addressed Chronic illness with exacerbation  Additional Data Reviewed and Analyzed Further history obtained from: Prior labs/imaging results  Factors Impacting ED Encounter Risk SDOH impact on management  [name], MD [facility] Emergency Medicine [facility] [email or web address]  Final Clinical Impressions(s) / ED Diagnoses     ICD-10-CM   1. Hypertension, unspecified type  I10     2. Abdominal pain, unspecified abdominal location  R10.9       ED Discharge Orders     None        Discharge Instructions Discussed with and Provided to Patient:    Discharge Instructions      You were evaluated in the Emergency Department and after careful evaluation, we did not find any emergent condition requiring admission or further testing in the hospital.  Your blood pressure is very high and it is very important that you restart your home medications as we discussed.  Please return to the Emergency Department if you experience any worsening of your condition.  Thank you for allowing [place] to be a part of your care.       Sabas Sous, MD 06/29/21 [phone]

## 2021-06-29 NOTE — Discharge Instructions (Signed)
You were evaluated in the Emergency Department and after careful evaluation, we did not find any emergent condition requiring admission or further testing in the hospital.  Your blood pressure is very high and it is very important that you restart your home medications as we discussed.  Please return to the Emergency Department if you experience any worsening of your condition.  Thank you for allowing [place] to be a part of your care.

## 2021-06-30 ENCOUNTER — Emergency Department (HOSPITAL_COMMUNITY)
Admission: EM | Admit: 2021-06-30 | Discharge: 2021-06-30 | Disposition: A | Discharge status: Home / Self Care | Payer: Self-pay | Attending: Emergency Medicine | Admitting: Emergency Medicine

## 2021-06-30 ENCOUNTER — Other Ambulatory Visit: Payer: Self-pay

## 2021-06-30 ENCOUNTER — Encounter (HOSPITAL_COMMUNITY): Payer: Self-pay | Admitting: *Deleted

## 2021-06-30 DIAGNOSIS — R1084 Generalized abdominal pain: Secondary | ICD-10-CM | POA: Insufficient documentation

## 2021-06-30 DIAGNOSIS — Z79899 Other long term (current) drug therapy: Secondary | ICD-10-CM | POA: Insufficient documentation

## 2021-06-30 DIAGNOSIS — I10 Essential (primary) hypertension: Secondary | ICD-10-CM | POA: Insufficient documentation

## 2021-06-30 DIAGNOSIS — R1010 Upper abdominal pain, unspecified: Secondary | ICD-10-CM

## 2021-06-30 MED ORDER — AMLODIPINE BESYLATE 5 MG PO TABS
10.0000 mg | ORAL_TABLET | Freq: Once | ORAL | Status: AC
  Administered 2021-06-30: 10 mg via ORAL
  Filled 2021-06-30: qty 2

## 2021-06-30 MED ORDER — HYDRALAZINE HCL 25 MG PO TABS
25.0000 mg | ORAL_TABLET | Freq: Once | ORAL | Status: AC
  Administered 2021-06-30: 25 mg via ORAL
  Filled 2021-06-30: qty 1

## 2021-06-30 NOTE — Discharge Instructions (Signed)
Follow-up with your family doctor next week for recheck. 

## 2021-06-30 NOTE — ED Provider Notes (Signed)
[facility] EMERGENCY DEPARTMENT Provider Note   CSN: [id number] Arrival date & time: 06/30/21  1757     History  Chief Complaint  Patient presents with   Abdominal Pain    Peter Meyer is a 61 y.o. male.  Patient has a history of hypertension.  He complains of lower abdominal pain.  He had a CT scan last week that was negative within the abdomen.  When I evaluated the patient he no longer had pain  The history is provided by the patient and medical records. No language interpreter was used.  Abdominal Pain Pain location:  Generalized Pain quality: aching   Pain radiates to:  Does not radiate Pain severity:  Mild Onset quality:  Sudden Timing:  Intermittent Progression:  Waxing and waning Chronicity:  New Context: not alcohol use   Relieved by:  Nothing Associated symptoms: no chest pain, no cough, no diarrhea, no fatigue and no hematuria       Home Medications Prior to Admission medications   Medication Sig Start Date End Date Taking? Authorizing Provider  amLODipine (NORVASC) 10 MG tablet Take 1 tablet (10 mg total) by mouth daily. 06/22/21   Veryl Speak, MD  cyclobenzaprine (FLEXERIL) 10 MG tablet Take 1 tablet (10 mg total) by mouth 3 (three) times daily as needed for muscle spasms. 05/23/20   Truddie Hidden, MD  hydrALAZINE (APRESOLINE) 25 MG tablet Take 1 tablet (25 mg total) by mouth 3 (three) times daily. 06/22/21 09/20/21  Veryl Speak, MD  ondansetron (ZOFRAN-ODT) 8 MG disintegrating tablet Take 1 tablet (8 mg total) by mouth every 8 (eight) hours as needed for nausea or vomiting. 06/22/21   Sherrill Raring, PA-C      Allergies    Patient has no known allergies.    Review of Systems   Review of Systems  Constitutional:  Negative for appetite change and fatigue.  HENT:  Negative for congestion, ear discharge and sinus pressure.   Eyes:  Negative for discharge.  Respiratory:  Negative for cough.   Cardiovascular:  Negative for chest pain.  Gastrointestinal:   Positive for abdominal pain. Negative for diarrhea.  Genitourinary:  Negative for frequency and hematuria.  Musculoskeletal:  Negative for back pain.  Skin:  Negative for rash.  Neurological:  Negative for seizures and headaches.  Psychiatric/Behavioral:  Negative for hallucinations.    Physical Exam Updated Vital Signs BP (!) 165/84    Pulse 76    Temp 98.3 F (36.8 C) (Oral)    Resp 18    Ht 6' (1.829 m)    Wt 87.1 kg    SpO2 100%    BMI 26.04 kg/m  Physical Exam Vitals and nursing note reviewed.  Constitutional:      Appearance: He is well-developed.  HENT:     Head: Normocephalic.  Eyes:     General: No scleral icterus.    Conjunctiva/sclera: Conjunctivae normal.  Neck:     Thyroid: No thyromegaly.  Cardiovascular:     Rate and Rhythm: Normal rate and regular rhythm.     Heart sounds: No murmur heard.   No friction rub. No gallop.  Pulmonary:     Breath sounds: No stridor. No wheezing or rales.  Chest:     Chest wall: No tenderness.  Abdominal:     General: There is no distension.     Tenderness: There is no abdominal tenderness. There is no rebound.  Musculoskeletal:        General: Normal range of  motion.     Cervical back: Neck supple.  Lymphadenopathy:     Cervical: No cervical adenopathy.  Skin:    Findings: No erythema or rash.  Neurological:     Mental Status: He is alert and oriented to person, place, and time.     Motor: No abnormal muscle tone.     Coordination: Coordination normal.  Psychiatric:        Behavior: Behavior normal.    ED Results / Procedures / Treatments   Labs (all labs ordered are listed, but only abnormal results are displayed) Labs Reviewed - No data to display  EKG None  Radiology No results found.  Procedures Procedures    Medications Ordered in ED Medications  hydrALAZINE (APRESOLINE) tablet 25 mg (25 mg Oral Given 06/30/21 1915)  amLODipine (NORVASC) tablet 10 mg (10 mg Oral Given 06/30/21 1915)    ED Course/  Medical Decision Making/ A&P                           Medical Decision Making Risk Prescription drug management.   Patient with chronic abdominal discomfort.  Normal CT scan last week.  Pain resolved now.  His blood pressure was elevated because he had not taken any medicine so we gave him some of his blood pressure medicine and it has improved.  He will follow-up with PCP    This patient presents to the ED for concern of abdominal pain, this involves an extensive number of treatment options, and is a complaint that carries with it a high risk of complications and morbidity.  The differential diagnosis includes exacerbation of chronic abdominal pain, appendicitis   Co morbidities that complicate the patient evaluation  Hypertension   Additional history obtained:  Additional history obtained from patient External records from outside source obtained and reviewed including hospital records   Lab Tests:  I Ordered, and personally interpreted labs.  The pertinent results include: None   Imaging Studies ordered:  No imaging  Cardiac Monitoring:  The patient was maintained on a cardiac monitor.  I personally viewed and interpreted the cardiac monitored which showed an underlying rhythm of: Normal sinus rhythm   Medicines ordered and prescription drug management:  I ordered medication including hydralazine and Norvasc for blood pressure Reevaluation of the patient after these medicines showed that the patient improved I have reviewed the patients home medicines and have made adjustments as needed   Test Considered:  CBC chemistry   Critical Interventions:  None   Consultations Obtained: None  Problem List / ED Course:  Hypertension chronic abdominal pain   Reevaluation:  After the interventions noted above, I reevaluated the patient and found that they have :improved   Social Determinants of Health:  Dementia  Dispostion:  After consideration of  the diagnostic results and the patients response to treatment, I feel that the patent would benefit from discharge home and to take his blood pressure medicine to follow-up with PCP.         Final Clinical Impression(s) / ED Diagnoses Final diagnoses:  Pain of upper abdomen    Rx / DC Orders ED Discharge Orders     None         Milton Ferguson, MD 07/02/21 1159

## 2021-06-30 NOTE — ED Triage Notes (Addendum)
Pt c/o lower mid abdominal pain that started about 30 minutes ago. Denies n/v/d.   Pt's BP is 214/102 in triage. Pt reports he hasn't taken his BP medication since Sunday because his daughter hasn't brought it to him.

## 2021-06-30 NOTE — ED Notes (Signed)
Vitals when going to discharge pt and BP 185/97. EDP made aware to see if still able to discharge and this RN given the good to discharge.

## 2021-07-01 ENCOUNTER — Encounter (HOSPITAL_COMMUNITY): Payer: Self-pay | Admitting: Emergency Medicine

## 2021-07-01 ENCOUNTER — Emergency Department (HOSPITAL_COMMUNITY)
Admission: EM | Admit: 2021-07-01 | Discharge: 2021-07-01 | Disposition: A | Discharge status: Home / Self Care | Payer: Self-pay | Attending: Emergency Medicine | Admitting: Emergency Medicine

## 2021-07-01 DIAGNOSIS — I10 Essential (primary) hypertension: Secondary | ICD-10-CM | POA: Insufficient documentation

## 2021-07-01 DIAGNOSIS — R1084 Generalized abdominal pain: Secondary | ICD-10-CM | POA: Insufficient documentation

## 2021-07-01 DIAGNOSIS — Z79899 Other long term (current) drug therapy: Secondary | ICD-10-CM | POA: Insufficient documentation

## 2021-07-01 MED ORDER — ALUM & MAG HYDROXIDE-SIMETH 200-200-20 MG/5ML PO SUSP
30.0000 mL | Freq: Once | ORAL | Status: AC
  Administered 2021-07-01: 30 mL via ORAL
  Filled 2021-07-01: qty 30

## 2021-07-01 MED ORDER — HYDRALAZINE HCL 25 MG PO TABS
25.0000 mg | ORAL_TABLET | Freq: Once | ORAL | Status: AC
  Administered 2021-07-01: 25 mg via ORAL
  Filled 2021-07-01: qty 1

## 2021-07-01 MED ORDER — LIDOCAINE VISCOUS HCL 2 % MT SOLN
15.0000 mL | Freq: Once | OROMUCOSAL | Status: AC
  Administered 2021-07-01: 15 mL via ORAL
  Filled 2021-07-01: qty 15

## 2021-07-01 MED ORDER — AMLODIPINE BESYLATE 5 MG PO TABS
10.0000 mg | ORAL_TABLET | Freq: Once | ORAL | Status: AC
  Administered 2021-07-01: 10 mg via ORAL
  Filled 2021-07-01: qty 2

## 2021-07-01 NOTE — Discharge Instructions (Addendum)
You were seen tonight for abdominal pain.  You have stated that vinegar upsets her stomach.  I would recommend avoiding vinegar in the future or other high acid foods.  I highly advise you to begin taking your home prescriptions for your blood pressure.  We gave you a dose of your home medications while at the visit tonight.  Long-term high blood pressure can lead to many adverse effects.  Return to the hospital if life-threatening conditions occur such as chest pain, shortness of breath, or altered level of consciousness

## 2021-07-01 NOTE — ED Provider Notes (Signed)
[facility] EMERGENCY DEPARTMENT Provider Note   CSN: [id number] Arrival date & time: 07/01/21  2006     History  Chief Complaint  Patient presents with   Abdominal Pain    Peter Meyer is a 61 y.o. male. The patient presents to the emergency department complaining of abdominal pain. He states that he had vinegar on his greens tonight and that he knows that vinegar hurts his stomach. He is hypertensive on arrival. I asked the patient if he takes his blood pressure medications at home and he states that he does not.  The patient is currently homeless.  Of note the patient was seen yesterday at this same facility for abdominal pain that resolved prior to provider contact, and he was administered his home blood pressure medications before discharge. He was seen on 2/10 for hypertension, 2/3 for nausea and vomiting, and 2/2 for generalized abdominal pain. He had a normal CT scan of the abdomen on 06/22/2021.  HPI     Home Medications Prior to Admission medications   Medication Sig Start Date End Date Taking? Authorizing Provider  amLODipine (NORVASC) 10 MG tablet Take 1 tablet (10 mg total) by mouth daily. 06/22/21   Geoffery Lyons, MD  cyclobenzaprine (FLEXERIL) 10 MG tablet Take 1 tablet (10 mg total) by mouth 3 (three) times daily as needed for muscle spasms. 05/23/20   Pollyann Savoy, MD  hydrALAZINE (APRESOLINE) 25 MG tablet Take 1 tablet (25 mg total) by mouth 3 (three) times daily. 06/22/21 09/20/21  Geoffery Lyons, MD  ondansetron (ZOFRAN-ODT) 8 MG disintegrating tablet Take 1 tablet (8 mg total) by mouth every 8 (eight) hours as needed for nausea or vomiting. 06/22/21   Theron Arista, PA-C      Allergies    Patient has no known allergies.    Review of Systems   Review of Systems  Constitutional:  Negative for appetite change.  Respiratory:  Negative for shortness of breath.   Cardiovascular:  Negative for chest pain.  Gastrointestinal:  Positive for abdominal pain. Negative for  constipation, diarrhea, nausea and vomiting.  Genitourinary:  Negative for flank pain.   Physical Exam Updated Vital Signs BP (!) 197/95    Pulse 77    Temp 98.1 F (36.7 C) (Oral)    Resp 18    Ht 6' (1.829 m)    Wt 87.1 kg    SpO2 100%    BMI 26.04 kg/m  Physical Exam Constitutional:      General: He is not in acute distress. HENT:     Head: Normocephalic.  Eyes:     Pupils: Pupils are equal, round, and reactive to light.  Cardiovascular:     Rate and Rhythm: Normal rate and regular rhythm.     Heart sounds: Normal heart sounds.  Pulmonary:     Effort: Pulmonary effort is normal.     Breath sounds: Normal breath sounds.  Abdominal:     General: Abdomen is flat. Bowel sounds are normal. There is no distension.     Palpations: Abdomen is soft.     Tenderness: There is no abdominal tenderness.  Skin:    General: Skin is warm and dry.  Neurological:     Mental Status: He is alert.  Psychiatric:        Behavior: Behavior normal.    ED Results / Procedures / Treatments   Labs (all labs ordered are listed, but only abnormal results are displayed) Labs Reviewed - No data to display  EKG None  Radiology No results found.  Procedures Procedures    Medications Ordered in ED Medications  alum & mag hydroxide-simeth (MAALOX/MYLANTA) 200-200-20 MG/5ML suspension 30 mL (30 mLs Oral Given 07/01/21 2045)    And  lidocaine (XYLOCAINE) 2 % viscous mouth solution 15 mL (15 mLs Oral Given 07/01/21 2046)  amLODipine (NORVASC) tablet 10 mg (10 mg Oral Given 07/01/21 2047)  hydrALAZINE (APRESOLINE) tablet 25 mg (25 mg Oral Given 07/01/21 2046)    ED Course/ Medical Decision Making/ A&P                           Medical Decision Making Risk OTC drugs. Prescription drug management.   This patient presents to the ED for concern of abdominal pain, this involves an extensive number of treatment options, and is a complaint that carries with it a high risk of complications and  morbidity.  The differential diagnosis includes but is not limited to gastritis, cholecystitis, small bowel obstruction, peptic ulcer disease, and more   Co morbidities that complicate the patient evaluation  None   Additional history obtained:  Additional history obtained from patient's sister External records from outside source obtained and reviewed including ED charts from 2/11, 2/10, 2/3, and 2/2   Lab Tests:  None   Imaging Studies ordered:  None   Cardiac Monitoring:  None   Medicines ordered and prescription drug management:  I ordered medication including GI cocktail  for acid reflux symptoms and antihypertensives for the patient's blood pressure  Reevaluation of the patient after these medicines showed that the patient improved I have reviewed the patients home medicines and have made adjustments as needed   Test Considered:  CT abdomen   Critical Interventions:  None   Consultations Obtained:  None   Reevaluation:  After the interventions noted above, I reevaluated the patient and found that they have :improved   Social Determinants of Health:  Patient is currently homeless   Dispostion:  After consideration of the diagnostic results and the patients response to treatment, I feel that the patent would benefit from discharge. I believe that the patient's abdominal pain was diet related. He has been seen multiple times with no definitive cause of his abdominal pain noted.  I provided a GI cocktail which improved the patient's stomach pain.  I gave his home dose of blood pressure medication.  There is no indication for lab work or imaging at this time.  The patient is feeling better and is resting in the bed comfortably.  I see no reason to consider hospital admission and believe the patient is safe to discharge at this time.  Return precautions provided.  I strongly recommended the patient begin taking antihypertensives at home   Final  Clinical Impression(s) / ED Diagnoses Final diagnoses:  Generalized abdominal pain    Rx / DC Orders ED Discharge Orders     None         Darrick Grinder, [place] 07/01/21 2149    Eber Hong, MD 07/08/21 [phone]

## 2021-07-01 NOTE — ED Triage Notes (Addendum)
Pt c/o abd pain that's started again today. Pt has been seen for same the past 2 days.   Pt's HTN noted in triage, pt states he hasn't taken any BP meds in several months. Pt currently homeless.

## 2021-07-09 ENCOUNTER — Emergency Department (HOSPITAL_COMMUNITY)
Admission: EM | Admit: 2021-07-09 | Discharge: 2021-07-09 | Disposition: A | Discharge status: Home / Self Care | Payer: Self-pay | Attending: Emergency Medicine | Admitting: Emergency Medicine

## 2021-07-09 ENCOUNTER — Encounter (HOSPITAL_COMMUNITY): Payer: Self-pay

## 2021-07-09 ENCOUNTER — Other Ambulatory Visit: Payer: Self-pay

## 2021-07-09 ENCOUNTER — Emergency Department (HOSPITAL_COMMUNITY): Payer: Self-pay

## 2021-07-09 DIAGNOSIS — R1030 Lower abdominal pain, unspecified: Secondary | ICD-10-CM

## 2021-07-09 DIAGNOSIS — R1084 Generalized abdominal pain: Secondary | ICD-10-CM | POA: Insufficient documentation

## 2021-07-09 DIAGNOSIS — Z79899 Other long term (current) drug therapy: Secondary | ICD-10-CM | POA: Insufficient documentation

## 2021-07-09 DIAGNOSIS — G8929 Other chronic pain: Secondary | ICD-10-CM | POA: Insufficient documentation

## 2021-07-09 DIAGNOSIS — I10 Essential (primary) hypertension: Secondary | ICD-10-CM | POA: Insufficient documentation

## 2021-07-09 LAB — COMPREHENSIVE METABOLIC PANEL
ALT: 13 U/L (ref 0–44)
AST: 20 U/L (ref 15–41)
Albumin: 4 g/dL (ref 3.5–5.0)
Alkaline Phosphatase: 65 U/L (ref 38–126)
Anion gap: 6 (ref 5–15)
BUN: 15 mg/dL (ref 8–23)
CO2: 30 mmol/L (ref 22–32)
Calcium: 9.5 mg/dL (ref 8.9–10.3)
Chloride: 101 mmol/L (ref 98–111)
Creatinine, Ser: 0.99 mg/dL (ref 0.61–1.24)
GFR, Estimated: >60 mL/min (ref >60)
Glucose, Bld: 72 mg/dL (ref 70–99)
Potassium: 4.2 mmol/L (ref 3.5–5.1)
Sodium: 137 mmol/L (ref 135–145)
Total Bilirubin: 0.9 mg/dL (ref 0.3–1.2)
Total Protein: 8.7 g/dL — ABNORMAL HIGH (ref 6.5–8.1)

## 2021-07-09 LAB — CBC
HCT: 44.3 % (ref 39.0–52.0)
Hemoglobin: 13.9 g/dL (ref 13.0–17.0)
MCH: 28.1 pg (ref 26.0–34.0)
MCHC: 31.4 g/dL (ref 30.0–36.0)
MCV: 89.7 fL (ref 80.0–100.0)
Platelets: 280 10*3/uL (ref 150–400)
RBC: 4.94 MIL/uL (ref 4.22–5.81)
RDW: 13.7 % (ref 11.5–15.5)
WBC: 5.9 10*3/uL (ref 4.0–10.5)
nRBC: 0 % (ref 0.0–0.2)

## 2021-07-09 LAB — LIPASE, BLOOD: Lipase: 48 U/L (ref 11–51)

## 2021-07-09 MED ORDER — HYDRALAZINE HCL 20 MG/ML IJ SOLN
5.0000 mg | Freq: Once | INTRAMUSCULAR | Status: AC
Start: 2021-07-09 — End: 2021-07-09
  Administered 2021-07-09: 5 mg via INTRAVENOUS
  Filled 2021-07-09: qty 1

## 2021-07-09 MED ORDER — DICYCLOMINE HCL 10 MG/ML IM SOLN
20.0000 mg | Freq: Once | INTRAMUSCULAR | Status: AC
  Administered 2021-07-09: 20 mg via INTRAMUSCULAR
  Filled 2021-07-09: qty 2

## 2021-07-09 MED ORDER — AMLODIPINE BESYLATE 5 MG PO TABS
10.0000 mg | ORAL_TABLET | Freq: Once | ORAL | Status: AC
  Administered 2021-07-09: 10 mg via ORAL
  Filled 2021-07-09 (×2): qty 2

## 2021-07-09 MED ORDER — DICYCLOMINE HCL 20 MG PO TABS: ORAL_TABLET | ORAL | 0 refills | Status: DC

## 2021-07-09 MED ORDER — HYDRALAZINE HCL 25 MG PO TABS
25.0000 mg | ORAL_TABLET | Freq: Once | ORAL | Status: AC
  Administered 2021-07-09: 25 mg via ORAL
  Filled 2021-07-09: qty 1

## 2021-07-09 NOTE — ED Triage Notes (Signed)
Patient states that his stomach has been hurting x 2 hours.

## 2021-07-09 NOTE — ED Provider Notes (Signed)
[facility] EMERGENCY DEPARTMENT Provider Note   CSN: [id number] Arrival date & time: 07/09/21  1432     History  Chief Complaint  Patient presents with   Abdominal Pain    Peter Meyer is a 61 y.o. male.  Patient with exacerbation of chronic abdominal pain.  Also patient has not been taking her blood pressure medicine  The history is provided by the patient and medical records. No language interpreter was used.  Abdominal Pain Pain location:  Generalized Pain quality: aching   Pain radiates to:  Does not radiate Pain severity:  Mild Onset quality:  Sudden Timing:  Constant Progression:  Waxing and waning Chronicity:  Recurrent Context: not alcohol use   Relieved by:  Nothing Associated symptoms: no chest pain, no cough, no diarrhea, no fatigue and no hematuria       Home Medications Prior to Admission medications   Medication Sig Start Date End Date Taking? Authorizing Provider  dicyclomine (BENTYL) 20 MG tablet Take 1 every 8 hours as needed for abdominal pain 07/09/21  Yes Bethann Berkshire, MD  ondansetron (ZOFRAN-ODT) 8 MG disintegrating tablet Take 1 tablet (8 mg total) by mouth every 8 (eight) hours as needed for nausea or vomiting. 06/22/21  Yes Theron Arista, PA-C  amLODipine (NORVASC) 10 MG tablet Take 1 tablet (10 mg total) by mouth daily. Patient not taking: Reported on 07/09/2021 06/22/21   Geoffery Lyons, MD      Allergies    Patient has no known allergies.    Review of Systems   Review of Systems  Constitutional:  Negative for appetite change and fatigue.  HENT:  Negative for congestion, ear discharge and sinus pressure.   Eyes:  Negative for discharge.  Respiratory:  Negative for cough.   Cardiovascular:  Negative for chest pain.  Gastrointestinal:  Positive for abdominal pain. Negative for diarrhea.  Genitourinary:  Negative for frequency and hematuria.  Musculoskeletal:  Negative for back pain.  Skin:  Negative for rash.  Neurological:  Negative for  seizures and headaches.  Psychiatric/Behavioral:  Negative for hallucinations.    Physical Exam Updated Vital Signs BP (!) 179/77    Pulse 70    Temp 97.9 F (36.6 C) (Oral)    Resp 18    Ht 6' (1.829 m)    Wt 87.1 kg    SpO2 99%    BMI 26.04 kg/m  Physical Exam Vitals and nursing note reviewed.  Constitutional:      Appearance: He is well-developed.  HENT:     Head: Normocephalic.     Nose: Nose normal.  Eyes:     General: No scleral icterus.    Conjunctiva/sclera: Conjunctivae normal.  Neck:     Thyroid: No thyromegaly.  Cardiovascular:     Rate and Rhythm: Normal rate and regular rhythm.     Heart sounds: No murmur heard.   No friction rub. No gallop.  Pulmonary:     Breath sounds: No stridor. No wheezing or rales.  Chest:     Chest wall: No tenderness.  Abdominal:     General: There is no distension.     Tenderness: There is no abdominal tenderness. There is no rebound.  Musculoskeletal:        General: Normal range of motion.     Cervical back: Neck supple.  Lymphadenopathy:     Cervical: No cervical adenopathy.  Skin:    Findings: No erythema or rash.  Neurological:     Mental Status: He is  alert and oriented to person, place, and time.     Motor: No abnormal muscle tone.     Coordination: Coordination normal.  Psychiatric:        Behavior: Behavior normal.    ED Results / Procedures / Treatments   Labs (all labs ordered are listed, but only abnormal results are displayed) Labs Reviewed  COMPREHENSIVE METABOLIC PANEL - Abnormal; Notable for the following components:      Result Value   Total Protein 8.7 (*)    All other components within normal limits  LIPASE, BLOOD  CBC  URINALYSIS, ROUTINE W REFLEX MICROSCOPIC    EKG None  Radiology DG ABD ACUTE 2+V W 1V CHEST  Result Date: 07/09/2021 CLINICAL DATA:  Stomach pain for several hours, initial encounter EXAM: DG ABDOMEN ACUTE WITH 1 VIEW CHEST COMPARISON:  CT from 06/22/2021 FINDINGS: Cardiac  shadow is within normal limits. Mild aortic calcifications are seen. The lungs are well aerated bilaterally. No bony abnormality is seen. Scattered large and small bowel gas is noted. No free air is seen. No obstructive changes are noted. Chronically calcified lymph nodes are again seen in the right upper quadrant similar to that noted on prior CT. Degenerative changes of lumbar spine are noted. IMPRESSION: No acute abnormality noted. Electronically Signed   By: Inez Catalina M.D.   On: 07/09/2021 19:21    Procedures Procedures    Medications Ordered in ED Medications  hydrALAZINE (APRESOLINE) injection 5 mg (5 mg Intravenous Given 07/09/21 1925)  hydrALAZINE (APRESOLINE) tablet 25 mg (25 mg Oral Given 07/09/21 1926)  amLODipine (NORVASC) tablet 10 mg (10 mg Oral Given 07/09/21 1932)  dicyclomine (BENTYL) injection 20 mg (20 mg Intramuscular Given 07/09/21 1925)    ED Course/ Medical Decision Making/ A&P  CRITICAL CARE Performed by: Milton Ferguson Total critical care time: 35 minutes Critical care time was exclusive of separately billable procedures and treating other patients. Critical care was necessary to treat or prevent imminent or life-threatening deterioration. Critical care was time spent personally by me on the following activities: development of treatment plan with patient and/or surrogate as well as nursing, discussions with consultants, evaluation of patient's response to treatment, examination of patient, obtaining history from patient or surrogate, ordering and performing treatments and interventions, ordering and review of laboratory studies, ordering and review of radiographic studies, pulse oximetry and re-evaluation of patient's condition.   Patient with poorly controlled blood pressure because he has not been taking it.  He improved with hydralazine IV and p.o.  Patient also with chronic abdominal pain Labs and x-rays of okay.  Patient will be placed on Bentyl and follow-up with  his PCP                         Medical Decision Making Amount and/or Complexity of Data Reviewed Labs: ordered. Radiology: ordered.  Risk Prescription drug management.   Poorly controlled hypertension and chronic abdominal pain.  He will get his blood pressure medicines and start taking it and also was given Bentyl for his belly\    This patient presents to the ED for concern of abdominal pain, this involves an extensive number of treatment options, and is a complaint that carries with it a high risk of complications and morbidity.  The differential diagnosis includes chronic abdominal pain, gastritis   Co morbidities that complicate the patient evaluation  Chronic abdominal pain   Additional history obtained:  Additional history obtained from patient External records from  outside source obtained and reviewed including hospital records   Lab Tests:  I Ordered, and personally interpreted labs.  The pertinent results include: BC and chemistries which were unremarkable   Imaging Studies ordered:  I ordered imaging studies including abdominal series I independently visualized and interpreted imaging which showed unremarkable I agree with the radiologist interpretation   Cardiac Monitoring:  The patient was maintained on a cardiac monitor.  I personally viewed and interpreted the cardiac monitored which showed an underlying rhythm of: Sinus rhythm   Medicines ordered and prescription drug management:  I ordered medication including hydralazine for blood pressure and Bentyl for abdominal Reevaluation of the patient after these medicines showed that the patient stayed the same I have reviewed the patients home medicines and have made adjustments as needed   Test Considered:  None   Critical Interventions:  None   Consultations Obtained:  No consult  Problem List / ED Course:  We will control blood pressure and chronic abdominal  pain   Reevaluation:  After the interventions noted above, I reevaluated the patient and found that they have :improved   Social Determinants of Health:  None   Dispostion:  After consideration of the diagnostic results and the patients response to treatment, I feel that the patent would benefit from discharge home with Bentyl and told to take his blood pressure medicine and follow-up with PCP.          Final Clinical Impression(s) / ED Diagnoses Final diagnoses:  Lower abdominal pain  Hypertension, unspecified type    Rx / DC Orders ED Discharge Orders          Ordered    dicyclomine (BENTYL) 20 MG tablet        07/09/21 2123              Milton Ferguson, MD 07/10/21 1757

## 2021-07-09 NOTE — Discharge Instructions (Signed)
Get your blood pressure medicine and make sure you take it.  Follow-up with your family doctor in a week to check your abdomen and your blood pressure

## 2021-07-11 ENCOUNTER — Emergency Department (HOSPITAL_COMMUNITY): Payer: Self-pay

## 2021-07-11 ENCOUNTER — Encounter (HOSPITAL_COMMUNITY): Payer: Self-pay | Admitting: Emergency Medicine

## 2021-07-11 ENCOUNTER — Other Ambulatory Visit: Payer: Self-pay

## 2021-07-11 DIAGNOSIS — I10 Essential (primary) hypertension: Secondary | ICD-10-CM | POA: Insufficient documentation

## 2021-07-11 DIAGNOSIS — Z79899 Other long term (current) drug therapy: Secondary | ICD-10-CM | POA: Insufficient documentation

## 2021-07-11 DIAGNOSIS — M7989 Other specified soft tissue disorders: Secondary | ICD-10-CM | POA: Insufficient documentation

## 2021-07-11 DIAGNOSIS — M79641 Pain in right hand: Secondary | ICD-10-CM | POA: Insufficient documentation

## 2021-07-11 DIAGNOSIS — W19XXXA Unspecified fall, initial encounter: Secondary | ICD-10-CM | POA: Insufficient documentation

## 2021-07-11 NOTE — ED Triage Notes (Addendum)
Pt states he fell today and now c/o right hand pain. Pt has been out of bp meds for 2 weeks, he states he is waiting on daughter to bring them to him.

## 2021-07-12 ENCOUNTER — Emergency Department (HOSPITAL_COMMUNITY)
Admission: EM | Admit: 2021-07-12 | Discharge: 2021-07-12 | Disposition: A | Discharge status: Home / Self Care | Payer: Self-pay | Attending: Emergency Medicine | Admitting: Emergency Medicine

## 2021-07-12 DIAGNOSIS — I10 Essential (primary) hypertension: Secondary | ICD-10-CM

## 2021-07-12 DIAGNOSIS — W19XXXA Unspecified fall, initial encounter: Secondary | ICD-10-CM

## 2021-07-12 MED ORDER — KETOROLAC TROMETHAMINE 60 MG/2ML IM SOLN
30.0000 mg | Freq: Once | INTRAMUSCULAR | Status: DC
  Filled 2021-07-12: qty 2

## 2021-07-12 NOTE — ED Provider Notes (Signed)
[facility] EMERGENCY DEPARTMENT Provider Note   CSN: [id number] Arrival date & time: 07/11/21  1925     History  Chief Complaint  Patient presents with   Peter Meyer is a 61 y.o. male.   Fall This is a recurrent problem. The current episode started 3 to 5 hours ago. The problem occurs constantly. The problem has not changed since onset.Pertinent negatives include no chest pain, no abdominal pain, no headaches and no shortness of breath. Nothing aggravates the symptoms. Nothing relieves the symptoms. He has tried nothing for the symptoms. The treatment provided no relief.      Home Medications Prior to Admission medications   Medication Sig Start Date End Date Taking? Authorizing Provider  amLODipine (NORVASC) 10 MG tablet Take 1 tablet (10 mg total) by mouth daily. Patient not taking: Reported on 07/09/2021 06/22/21   Geoffery Lyons, MD  dicyclomine (BENTYL) 20 MG tablet Take 1 every 8 hours as needed for abdominal pain 07/09/21   Bethann Berkshire, MD  ondansetron (ZOFRAN-ODT) 8 MG disintegrating tablet Take 1 tablet (8 mg total) by mouth every 8 (eight) hours as needed for nausea or vomiting. 06/22/21   Theron Arista, PA-C      Allergies    Patient has no known allergies.    Review of Systems   Review of Systems  Respiratory:  Negative for shortness of breath.   Cardiovascular:  Negative for chest pain.  Gastrointestinal:  Negative for abdominal pain.  Neurological:  Negative for headaches.   Physical Exam Updated Vital Signs BP (!) 208/102    Pulse 68    Temp 98.6 F (37 C) (Oral)    Resp 17    Ht 6' (1.829 m)    Wt 87.1 kg    SpO2 100%    BMI 26.04 kg/m  Physical Exam Vitals and nursing note reviewed.  Constitutional:      Appearance: He is well-developed.  HENT:     Head: Normocephalic and atraumatic.     Mouth/Throat:     Mouth: Mucous membranes are moist.     Pharynx: Oropharynx is clear.  Eyes:     Pupils: Pupils are equal, round, and reactive to  light.  Cardiovascular:     Rate and Rhythm: Normal rate.  Pulmonary:     Effort: Pulmonary effort is normal. No respiratory distress.  Abdominal:     General: Abdomen is flat. There is no distension.  Musculoskeletal:        General: Swelling (medial right hand over metacarpal area) and tenderness present. Normal range of motion.     Cervical back: Normal range of motion.  Skin:    General: Skin is warm and dry.  Neurological:     General: No focal deficit present.     Mental Status: He is alert.    ED Results / Procedures / Treatments   Labs (all labs ordered are listed, but only abnormal results are displayed) Labs Reviewed - No data to display  EKG None  Radiology DG Hand Complete Right  Result Date: 07/11/2021 CLINICAL DATA:  Status post fall with subsequent right hand pain. EXAM: RIGHT HAND - COMPLETE 3+ VIEW COMPARISON:  None. FINDINGS: There is no evidence of fracture or dislocation. There is no evidence of arthropathy or other focal bone abnormality. Mild dorsal soft tissue swelling is seen along the mid metacarpal region. IMPRESSION: Mild dorsal soft tissue swelling without an acute osseous abnormality. Electronically Signed   By: Waylan Rocher  Houston M.D.   On: 07/11/2021 20:12    Procedures Procedures    Medications Ordered in ED Medications  ketorolac (TORADOL) injection 30 mg (30 mg Intramuscular Not Given 07/12/21 0222)    ED Course/ Medical Decision Making/ A&P                           Medical Decision Making Amount and/or Complexity of Data Reviewed Radiology: ordered.  Risk Prescription drug management.   No fracture. Just soft tissue injury. Suggested supportive care.   Final Clinical Impression(s) / ED Diagnoses Final diagnoses:  Fall, initial encounter  Hypertension, unspecified type    Rx / DC Orders ED Discharge Orders     None         Katharin Schneider, Barbara Cower, MD 07/12/21 [phone]

## 2021-07-12 NOTE — ED Notes (Signed)
Ace wrap applied to right hand and ice pack given to pt. Pt states he is allergic to needles and refused Toradol injection at this time.

## 2021-07-13 ENCOUNTER — Emergency Department (HOSPITAL_COMMUNITY)
Admission: EM | Admit: 2021-07-13 | Discharge: 2021-07-13 | Disposition: A | Discharge status: Home / Self Care | Payer: Self-pay | Attending: Emergency Medicine | Admitting: Emergency Medicine

## 2021-07-13 ENCOUNTER — Other Ambulatory Visit: Payer: Self-pay

## 2021-07-13 ENCOUNTER — Encounter (HOSPITAL_COMMUNITY): Payer: Self-pay

## 2021-07-13 DIAGNOSIS — M79662 Pain in left lower leg: Secondary | ICD-10-CM | POA: Insufficient documentation

## 2021-07-13 DIAGNOSIS — M79661 Pain in right lower leg: Secondary | ICD-10-CM | POA: Insufficient documentation

## 2021-07-13 DIAGNOSIS — R252 Cramp and spasm: Secondary | ICD-10-CM | POA: Insufficient documentation

## 2021-07-13 MED ORDER — ACETAMINOPHEN 500 MG PO TABS
1000.0000 mg | ORAL_TABLET | Freq: Once | ORAL | Status: AC
  Administered 2021-07-13: 1000 mg via ORAL
  Filled 2021-07-13: qty 2

## 2021-07-13 NOTE — ED Triage Notes (Signed)
Pt ambulatory to er, pt states that he his here for leg pain, states that his legs have been hurting for a while.  Denies injury

## 2021-07-13 NOTE — Discharge Instructions (Signed)
Use Tylenol as needed for leg pain, make sure you are drinking plenty of water.  Follow-up with your regular doctor if symptoms continue.  Your blood pressure was elevated today, please take prescribed medication and follow-up with your PCP.

## 2021-07-13 NOTE — ED Provider Notes (Signed)
[facility] EMERGENCY DEPARTMENT Provider Note   CSN: [id number] Arrival date & time: 07/13/21  1716     History  Chief Complaint  Patient presents with   Leg Pain    ANTERIO DEMLOW is a 61 y.o. male.  BENNEY YONEDA is a 61 y.o. male who presents with bilateral calf pain. The pain started at about 10 AM, when he stood up from sitting, and lasted through 5 pm. Patient reports while he was walking to the emergency department his leg cramps seem to improve.  He is not having cramping or pain elsewhere.  He has not had much to drink today and says he is probably dehydrated. He has had muscle cramps before, and he says this feels like the same. He denies any trauma, swelling, weakness, changes to sensation, or dizziness.   The history is provided by the patient.      Home Medications Prior to Admission medications   Medication Sig Start Date End Date Taking? Authorizing Provider  amLODipine (NORVASC) 10 MG tablet Take 1 tablet (10 mg total) by mouth daily. Patient not taking: Reported on 07/09/2021 06/22/21   Veryl Speak, MD  dicyclomine (BENTYL) 20 MG tablet Take 1 every 8 hours as needed for abdominal pain 07/09/21   Milton Ferguson, MD  ondansetron (ZOFRAN-ODT) 8 MG disintegrating tablet Take 1 tablet (8 mg total) by mouth every 8 (eight) hours as needed for nausea or vomiting. 06/22/21   Sherrill Raring, PA-C      Allergies    Patient has no known allergies.    Review of Systems   Review of Systems  Constitutional:  Negative for chills and fever.  Respiratory:  Negative for shortness of breath.   Cardiovascular:  Negative for chest pain and leg swelling.  Musculoskeletal:  Positive for myalgias.  Neurological:  Negative for weakness and numbness.   Physical Exam Updated Vital Signs BP (!) 202/97 (BP Location: Right Arm)    Pulse 75    Temp 97.8 F (36.6 C) (Oral)    Resp 18    Ht 6' (1.829 m)    Wt 87.1 kg    SpO2 100%    BMI 26.04 kg/m  Physical Exam Vitals and nursing  note reviewed.  Constitutional:      General: He is not in acute distress.    Appearance: Normal appearance. He is well-developed. He is not ill-appearing or diaphoretic.  HENT:     Head: Normocephalic and atraumatic.  Eyes:     General:        Right eye: No discharge.        Left eye: No discharge.  Cardiovascular:     Pulses: Normal pulses.          Posterior tibial pulses are 2+ on the right side and 2+ on the left side.  Pulmonary:     Effort: Pulmonary effort is normal. No respiratory distress.  Musculoskeletal:        General: No tenderness or deformity.     Right lower leg: No edema.     Left lower leg: No edema.     Comments: No significant tenderness, swelling or deformity over bilateral lower extremities, no overlying erythema or skin changes, distal pulses 2+, normal range of motion of all joints.  Neurological:     Mental Status: He is alert and oriented to person, place, and time.     Coordination: Coordination normal.  Psychiatric:        Mood and Affect:  Mood normal.        Behavior: Behavior normal.    ED Results / Procedures / Treatments   Labs (all labs ordered are listed, but only abnormal results are displayed) Labs Reviewed - No data to display  EKG None  Radiology DG Hand Complete Right  Result Date: 07/11/2021 CLINICAL DATA:  Status post fall with subsequent right hand pain. EXAM: RIGHT HAND - COMPLETE 3+ VIEW COMPARISON:  None. FINDINGS: There is no evidence of fracture or dislocation. There is no evidence of arthropathy or other focal bone abnormality. Mild dorsal soft tissue swelling is seen along the mid metacarpal region. IMPRESSION: Mild dorsal soft tissue swelling without an acute osseous abnormality. Electronically Signed   By: Virgina Norfolk M.D.   On: 07/11/2021 20:12    Procedures Procedures    Medications Ordered in ED Medications  acetaminophen (TYLENOL) tablet 1,000 mg (1,000 mg Oral Given 07/13/21 1801)    ED Course/ Medical  Decision Making/ A&P                           61 year old male presents with leg cramping bilaterally that started earlier today, overall resolved while he was walking to the emergency department today.  On exam he has no swelling, overlying skin changes, deformity, distal pulses 2+ and normal range of motion of all joints.  Thinks he may be dehydrated.  Patient hypertensive but similar to prior ED encounters and does not appear to be having symptoms.  Exam does not suggest DVT, cellulitis, septic arthritis, fracture.  Symptoms have already resolved prior to patient coming in today.  Patient worried about dehydration but is tolerating p.o. fluids, provided water and Tylenol.  Do not feel that further emergent work-up is indicated at this time.  Discharged home with outpatient follow-up.        Final Clinical Impression(s) / ED Diagnoses Final diagnoses:  Leg cramping    Rx / DC Orders ED Discharge Orders     None         Janet Berlin 07/13/21 1828    Wyvonnia Dusky, MD 07/14/21 1002

## 2021-07-14 ENCOUNTER — Other Ambulatory Visit: Payer: Self-pay

## 2021-07-14 ENCOUNTER — Emergency Department (HOSPITAL_COMMUNITY)
Admission: EM | Admit: 2021-07-14 | Discharge: 2021-07-14 | Disposition: A | Discharge status: Home / Self Care | Payer: Self-pay | Attending: Emergency Medicine | Admitting: Emergency Medicine

## 2021-07-14 ENCOUNTER — Encounter (HOSPITAL_COMMUNITY): Payer: Self-pay

## 2021-07-14 DIAGNOSIS — M542 Cervicalgia: Secondary | ICD-10-CM

## 2021-07-14 DIAGNOSIS — Z79899 Other long term (current) drug therapy: Secondary | ICD-10-CM | POA: Insufficient documentation

## 2021-07-14 DIAGNOSIS — I10 Essential (primary) hypertension: Secondary | ICD-10-CM | POA: Insufficient documentation

## 2021-07-14 DIAGNOSIS — R1013 Epigastric pain: Secondary | ICD-10-CM | POA: Insufficient documentation

## 2021-07-14 DIAGNOSIS — Z5900 Homelessness unspecified: Secondary | ICD-10-CM | POA: Insufficient documentation

## 2021-07-14 DIAGNOSIS — M79662 Pain in left lower leg: Secondary | ICD-10-CM | POA: Insufficient documentation

## 2021-07-14 DIAGNOSIS — R110 Nausea: Secondary | ICD-10-CM | POA: Insufficient documentation

## 2021-07-14 MED ORDER — AMLODIPINE BESYLATE 5 MG PO TABS: 5.0000 mg | ORAL_TABLET | Freq: Every day | ORAL | 0 refills | Status: DC

## 2021-07-14 MED ORDER — AMLODIPINE BESYLATE 5 MG PO TABS
10.0000 mg | ORAL_TABLET | Freq: Once | ORAL | Status: AC
  Administered 2021-07-14: 10 mg via ORAL
  Filled 2021-07-14: qty 2

## 2021-07-14 NOTE — ED Notes (Signed)
Pt refused to sign for d/c. Security called to bedside to escort pt out of the dept. Steady gait while leaving and NAD.

## 2021-07-14 NOTE — Discharge Instructions (Signed)
Continue your medications as previously prescribed.  Follow-up with your primary doctor.

## 2021-07-14 NOTE — ED Triage Notes (Signed)
Reports sleeping on the ground and now his neck is hurting

## 2021-07-14 NOTE — ED Provider Notes (Signed)
[facility] EMERGENCY DEPARTMENT Provider Note   CSN: [id number] Arrival date & time: 07/14/21  9201     History  Chief Complaint  Patient presents with   Nausea    Peter Meyer is a 61 y.o. male.  Patient is a 61 year old male with history of hypertension.  Patient presenting today with complaints of epigastric pain.  This started just prior to arrival.  Patient was seen here earlier today with bilateral calf pain.  He was treated, then discharged and returned several hours later, now with abdominal pain.  He denies any vomiting.  He denies any fevers or chills.  Patient was noted to be eating in the waiting room before and after being triaged.  The history is provided by the patient.      Home Medications Prior to Admission medications   Medication Sig Start Date End Date Taking? Authorizing Provider  amLODipine (NORVASC) 10 MG tablet Take 1 tablet (10 mg total) by mouth daily. Patient not taking: Reported on 07/09/2021 06/22/21   Geoffery Lyons, MD  dicyclomine (BENTYL) 20 MG tablet Take 1 every 8 hours as needed for abdominal pain 07/09/21   Bethann Berkshire, MD  ondansetron (ZOFRAN-ODT) 8 MG disintegrating tablet Take 1 tablet (8 mg total) by mouth every 8 (eight) hours as needed for nausea or vomiting. 06/22/21   Theron Arista, PA-C      Allergies    Patient has no known allergies.    Review of Systems   Review of Systems  All other systems reviewed and are negative.  Physical Exam Updated Vital Signs BP (!) 225/108    Pulse 64    Temp 98.3 F (36.8 C)    Resp 17    Ht 6' (1.829 m)    Wt 87.1 kg    SpO2 100%    BMI 26.04 kg/m  Physical Exam Vitals and nursing note reviewed.  Constitutional:      General: He is not in acute distress.    Appearance: He is well-developed. He is not diaphoretic.  HENT:     Head: Normocephalic and atraumatic.  Cardiovascular:     Rate and Rhythm: Normal rate and regular rhythm.     Heart sounds: No murmur heard.   No friction rub.   Pulmonary:     Effort: Pulmonary effort is normal. No respiratory distress.     Breath sounds: Normal breath sounds. No wheezing or rales.  Abdominal:     General: Bowel sounds are normal. There is no distension.     Palpations: Abdomen is soft.     Tenderness: There is no abdominal tenderness.  Musculoskeletal:        General: Normal range of motion.     Cervical back: Normal range of motion and neck supple.  Skin:    General: Skin is warm and dry.  Neurological:     Mental Status: He is alert and oriented to person, place, and time.     Coordination: Coordination normal.    ED Results / Procedures / Treatments   Labs (all labs ordered are listed, but only abnormal results are displayed) Labs Reviewed - No data to display  EKG None  Radiology No results found.  Procedures Procedures    Medications Ordered in ED Medications - No data to display  ED Course/ Medical Decision Making/ A&P  Patient presenting here with complaints of epigastric pain.  Patient appears in no discomfort and was eating in triage.  His abdomen is completely benign.  Patient presents to the ER frequently and is homeless.  He is requesting something to eat and tells me that he believes that he is "just hungry".  I do not feel as though any work-up is indicated.  Patient had multiple laboratory studies performed 3 days ago at a prior presentation.  Final Clinical Impression(s) / ED Diagnoses Final diagnoses:  None    Rx / DC Orders ED Discharge Orders     None         Geoffery Lyons, MD 07/14/21 [phone]

## 2021-07-14 NOTE — ED Triage Notes (Addendum)
Ambulatory to ED . Cc of nausea. Was eating in lobby. States he is out of his medications and needs refill

## 2021-07-14 NOTE — ED Provider Notes (Signed)
[facility] EMERGENCY DEPARTMENT Provider Note   CSN: [id number] Arrival date & time: 07/14/21  2011     History  Chief Complaint  Patient presents with   Neck Pain    Peter Meyer is a 61 y.o. male.  The history is provided by the patient.  Neck Pain Pain location:  Generalized neck Quality:  Aching Pain severity:  Moderate Onset quality:  Gradual Timing:  Constant Progression:  Unchanged Chronicity:  New Associated symptoms: headaches   Associated symptoms: no chest pain, no fever and no weakness   Patient with history of hypertension presents with neck pain and headache.  Patient reports he slept on the ground and woke up this morning with headache and neck pain.  No falls or trauma.  Denies any other complaints   Past Medical History:  Diagnosis Date   Hypertension     Home Medications Prior to Admission medications   Medication Sig Start Date End Date Taking? Authorizing Provider  amLODipine (NORVASC) 5 MG tablet Take 1 tablet (5 mg total) by mouth daily. 07/14/21  Yes Ripley Fraise, MD      Allergies    Patient has no known allergies.    Review of Systems   Review of Systems  Constitutional:  Negative for fever.  Cardiovascular:  Negative for chest pain.  Musculoskeletal:  Positive for neck pain.  Neurological:  Positive for headaches. Negative for weakness.   Physical Exam Updated Vital Signs BP (!) 200/102    Pulse 70    Temp 98.3 F (36.8 C) (Oral)    Resp 18    SpO2 100%  Physical Exam CONSTITUTIONAL: Elderly, resting comfortably HEAD: Normocephalic/atraumatic EYES: EOMI/PERRL ENMT: Mucous membranes moist NECK: supple no meningeal signs SPINE/BACK: Cervical paraspinal tenderness noted No bruising/crepitance/stepoffs noted to spine CV: S1/S2 noted, no murmurs/rubs/gallops noted LUNGS: Lungs are clear to auscultation bilaterally, no apparent distress ABDOMEN: soft, nontender NEURO: Pt is awake/alert/appropriate, moves all extremitiesx4.   No facial droop.  No arm or leg drift EXTREMITIES: pulses normal/equal, full ROM SKIN: warm, color normal PSYCH: no abnormalities of mood noted, alert and oriented to situation  ED Results / Procedures / Treatments   Labs (all labs ordered are listed, but only abnormal results are displayed) Labs Reviewed - No data to display  EKG None  Radiology No results found.  Procedures Procedures    Medications Ordered in ED Medications  amLODipine (NORVASC) tablet 10 mg (has no administration in time range)    ED Course/ Medical Decision Making/ A&P                           Medical Decision Making Risk Prescription drug management.   Patient presents for his 10th ER visit this month.  Patient typically presents for multiple vague complaints.  Today he reports his neck and head are sore from sleeping on the floor.  He is in no acute distress He reports he has not taken his home amlodipine.  I will give him a prescription, and place social work consult to assist with primary care placement. Further work-up not indicated at this time        Final Clinical Impression(s) / ED Diagnoses Final diagnoses:  Neck pain  Primary hypertension    Rx / DC Orders ED Discharge Orders          Ordered    amLODipine (NORVASC) 5 MG tablet  Daily        07/14/21 2337  Ripley Fraise, MD 07/14/21 2340

## 2021-07-19 ENCOUNTER — Other Ambulatory Visit: Payer: Self-pay

## 2021-07-19 ENCOUNTER — Encounter (HOSPITAL_COMMUNITY): Payer: Self-pay | Admitting: Emergency Medicine

## 2021-07-19 ENCOUNTER — Emergency Department (HOSPITAL_COMMUNITY)
Admission: EM | Admit: 2021-07-19 | Discharge: 2021-07-20 | Disposition: A | Discharge status: Home / Self Care | Payer: Self-pay | Attending: Student | Admitting: Student

## 2021-07-19 DIAGNOSIS — I10 Essential (primary) hypertension: Secondary | ICD-10-CM | POA: Insufficient documentation

## 2021-07-19 DIAGNOSIS — S0502XA Injury of conjunctiva and corneal abrasion without foreign body, left eye, initial encounter: Secondary | ICD-10-CM | POA: Insufficient documentation

## 2021-07-19 DIAGNOSIS — F17210 Nicotine dependence, cigarettes, uncomplicated: Secondary | ICD-10-CM | POA: Insufficient documentation

## 2021-07-19 DIAGNOSIS — W228XXA Striking against or struck by other objects, initial encounter: Secondary | ICD-10-CM | POA: Insufficient documentation

## 2021-07-19 DIAGNOSIS — Z79899 Other long term (current) drug therapy: Secondary | ICD-10-CM | POA: Insufficient documentation

## 2021-07-19 MED ORDER — FLUORESCEIN SODIUM 1 MG OP STRP
1.0000 | ORAL_STRIP | Freq: Once | OPHTHALMIC | Status: AC
Start: 2021-07-20 — End: 2021-07-19
  Administered 2021-07-19: 1 via OPHTHALMIC
  Filled 2021-07-19: qty 1

## 2021-07-19 MED ORDER — ERYTHROMYCIN 5 MG/GM OP OINT
TOPICAL_OINTMENT | Freq: Once | OPHTHALMIC | Status: AC
  Administered 2021-07-20: 1 via OPHTHALMIC
  Filled 2021-07-19: qty 3.5

## 2021-07-19 MED ORDER — TETRACAINE HCL 0.5 % OP SOLN
1.0000 [drp] | Freq: Once | OPHTHALMIC | Status: AC
  Administered 2021-07-19: 1 [drp] via OPHTHALMIC
  Filled 2021-07-19: qty 4

## 2021-07-19 NOTE — ED Triage Notes (Signed)
Pt reports he walked into a bush and it scratched his left eye today  ?

## 2021-07-20 NOTE — ED Provider Notes (Signed)
?[facility] EMERGENCY DEPARTMENT ?Provider Note ? ?CSN: [id number] ?Arrival date & time: 07/19/21 2133 ? ?Chief Complaint(s) ?Eye Pain ? ?HPI ?Peter Meyer is a 61 y.o. 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  Patient states that approximately 9 PM this evening he struck his left eye on a tree branch and he is having pain in the eye.  He currently does not have vision in that eye, and did not have any vision prior to this injury today..  Denies additional traumatic complaints. ? ? ?Eye Pain ? ? ?Past Medical History ?Past Medical History:  ?Diagnosis Date  ? Hypertension   ? ?There are no problems to display for this patient. ? ?Home Medication(s) ?Prior to Admission medications   ?Medication Sig Start Date End Date Taking? Authorizing Provider  ?amLODipine (NORVASC) 5 MG tablet Take 1 tablet (5 mg total) by mouth daily. 07/14/21   Zadie Rhine, MD  ?                                                                                                                                  ?Past Surgical History ?History reviewed. No pertinent surgical history. ?Family History ?History reviewed. No pertinent family history. ? ?Social History ?Social History  ? ?Tobacco Use  ? Smoking status: Every Day  ?  Types: Cigarettes  ? Smokeless tobacco: Never  ? Tobacco comments:  ?  3-4 cigarettes daily   ?Vaping Use  ? Vaping Use: Never used  ?Substance Use Topics  ? Alcohol use: Yes  ?  Alcohol/week: 2.0 standard drinks  ?  Types: 2 Cans of beer per week  ?  Comment: weekends  ? Drug use: No  ? ?Allergies ?Patient has no known allergies. ? ?Review of Systems ?Review of Systems  ?Eyes:  Positive for pain.  ? ?Physical Exam ?Vital Signs  ?I have reviewed the triage vital signs ?BP (!) 220/111   Pulse 68   Temp 98.3 ?F (36.8 ?C) (Oral)   Resp 18   Ht 6' (1.829 m)   Wt 87.1 kg   SpO2 100%   BMI 26.04 kg/m?  ? ?Physical  Exam ?Vitals and nursing note reviewed.  ?Constitutional:   ?   General: He is not in acute distress. ?   Appearance: He is well-developed.  ?HENT:  ?   Head: Normocephalic.  ?   Comments: Left eye conjunctival injection with visible cataract ?Eyes:  ?   Conjunctiva/sclera: Conjunctivae normal.  ?Cardiovascular:  ?   Rate and Rhythm: Normal rate and regular rhythm.  ?   Heart sounds: No murmur heard. ?Pulmonary:  ?   Effort: Pulmonary effort is normal. No respiratory distress.  ?   Breath sounds: Normal breath sounds.  ?Abdominal:  ?   Palpations: Abdomen is soft.  ?   Tenderness: There is no abdominal  tenderness.  ?Musculoskeletal:     ?   General: No swelling.  ?   Cervical back: Neck supple.  ?Skin: ?   General: Skin is warm and dry.  ?   Capillary Refill: Capillary refill takes less than 2 seconds.  ?Neurological:  ?   Mental Status: He is alert.  ?Psychiatric:     ?   Mood and Affect: Mood normal.  ? ? ?ED Results and Treatments ?Labs ?(all labs ordered are listed, but only abnormal results are displayed) ?Labs Reviewed - No data to display                                                                                                                       ? ?Radiology ?No results found. ? ?Pertinent labs & imaging results that were available during my care of the patient were reviewed by me and considered in my medical decision making (see MDM for details). ? ?Medications Ordered in ED ?Medications  ?erythromycin ophthalmic ointment (has no administration in time range)  ?tetracaine (PONTOCAINE) 0.5 % ophthalmic solution 1 drop (1 drop Both Eyes Given 07/19/21 2358)  ?fluorescein ophthalmic strip 1 strip (1 strip Both Eyes Given 07/19/21 2358)  ?                                                               ?                                                                    ?Procedures ?Procedures ? ?(including critical care time) ? ?Medical Decision Making / ED Course ? ? ?This patient presents to the ED for  concern of left eye pain, this involves an extensive number of treatment options, and is a complaint that carries with it a high risk of complications and morbidity.  The differential diagnosis includes corneal abrasion, corneal ulcer, retained foreign body ? ?MDM: ?Patient seen in the emergency department for evaluation of eye pain.  Physical exam reveals a cataract in the left eye, associated conjunctival injection.  Fluorescein exam was performed that shows a corneal abrasion at 6:00 in the left eye.  Patient given erythromycin ointment and will follow-up with ophthalmology in 1 week.  Of note, patient did have severely elevated blood pressures and these are previously documented in previous visits.  Patient states that he did not take blood pressure medications today.  As patient has no evidence or complaints associate with hypertensive emergency and the patient likely lives with higher systolic blood pressures, we will not  aggressively treat his blood pressure here in the emergency department and he was instructed to return home and take his amlodipine.  Tight blood pressure control in the emergency department in the absence of hypertensive emergency has been associated with worse outcomes and as the patient is asymptomatic we will opt for home treatment with his oral antihypertensives.  Patient safe for discharge with outpatient follow-up. ? ? ?Additional history obtained: ? ?-External records from outside source obtained and reviewed including: Chart review including previous notes, labs, imaging, consultation notes ? ? ?Medicines ordered and prescription drug management: ?Meds ordered this encounter  ?Medications  ? tetracaine (PONTOCAINE) 0.5 % ophthalmic solution 1 drop  ? fluorescein ophthalmic strip 1 strip  ? erythromycin ophthalmic ointment  ?  ?-I have reviewed the patients home medicines and have made adjustments as needed ? ?Critical interventions ?none ? ? ?Cardiac Monitoring: ?The patient was  maintained on a cardiac monitor.  I personally viewed and interpreted the cardiac monitored which showed an underlying rhythm of: none ? ?Social Determinants of Health:  ?Factors impacting patients care include: none ? ? ?Reevaluation: ?After the interventions noted above, I reevaluated the patient and found that they have :improved ? ?Co morbidities that complicate the patient evaluation ? ?Past Medical History:  ?Diagnosis Date  ? Hypertension   ?  ? ? ?Dispostion: ?I considered admission for this patient, but as I pain improved with tetracaine and visible corneal abrasion, patient safe for outpatient follow-up with erythromycin ointment. ? ? ? ? ?Final Clinical Impression(s) / ED Diagnoses ?Final diagnoses:  ?Abrasion of left cornea, initial encounter  ? ? ? ?@PCDICTATION @ ? ?  ? , MD ?07/20/21 0010 ? ?

## 2021-07-21 ENCOUNTER — Other Ambulatory Visit: Payer: Self-pay

## 2021-07-21 ENCOUNTER — Encounter (HOSPITAL_COMMUNITY): Payer: Self-pay

## 2021-07-21 DIAGNOSIS — H5712 Ocular pain, left eye: Secondary | ICD-10-CM | POA: Insufficient documentation

## 2021-07-21 DIAGNOSIS — I10 Essential (primary) hypertension: Secondary | ICD-10-CM | POA: Insufficient documentation

## 2021-07-21 DIAGNOSIS — Z9114 Patient's other noncompliance with medication regimen: Secondary | ICD-10-CM | POA: Insufficient documentation

## 2021-07-21 DIAGNOSIS — Z79899 Other long term (current) drug therapy: Secondary | ICD-10-CM | POA: Insufficient documentation

## 2021-07-21 DIAGNOSIS — Z5900 Homelessness unspecified: Secondary | ICD-10-CM | POA: Insufficient documentation

## 2021-07-21 NOTE — ED Triage Notes (Signed)
PT is homeless and wants somewhere to stay. Also said he is hungry.  ?Left eye pain that has been going on for a year.  ?

## 2021-07-22 ENCOUNTER — Emergency Department (HOSPITAL_COMMUNITY)
Admission: EM | Admit: 2021-07-22 | Discharge: 2021-07-22 | Disposition: A | Discharge status: Home / Self Care | Payer: Self-pay | Attending: Emergency Medicine | Admitting: Emergency Medicine

## 2021-07-22 DIAGNOSIS — Z9114 Patient's other noncompliance with medication regimen: Secondary | ICD-10-CM

## 2021-07-22 DIAGNOSIS — R030 Elevated blood-pressure reading, without diagnosis of hypertension: Secondary | ICD-10-CM

## 2021-07-22 DIAGNOSIS — Z5900 Homelessness unspecified: Secondary | ICD-10-CM

## 2021-07-22 NOTE — ED Provider Notes (Signed)
?[facility] ?Provider Note ? ? ?CSN: [id number] ?Arrival date & time: 07/21/21  1938 ? ?  ? ?History ? ?Chief Complaint  ?Patient presents with  ? Homeless  ? Eye Pain  ?  1 year  ? ? ?STANISLAV GERVASE is a 61 y.o. male. ? ?This is a 61 y.o. male with significant medical history as below, including hypertension, homeless who presents to the ED with complaint of eye pain, seeking place to sleep.  Patient chronic pain to his left eye.  Patient was seen 2 days ago diagnosed with a corneal abrasion.  Prescribed eye ointment given outpatient follow-up with ophthalmology.  He has no vision to his left eye for around 12 mos secondary to injury, pos cataract.  Patient is chronically elevated blood pressure, variable compliance with home antihypertensives.  Patient with no chest pain, dyspnea, lightheadedness, palpitations, no numbness or tingling.  He is tolerant oral intake without difficulty.  No change in bowel or bladder function.  No further injuries.  No headache or neck pain. ? ? ? ? ?Past Medical History: ?No date: Hypertension ? ?History reviewed. No pertinent surgical history.  ? ? ?The history is provided by the patient. No language interpreter was used.  ? ?  ? ?Home Medications ?Prior to Admission medications   ?Medication Sig Start Date End Date Taking? Authorizing Provider  ?amLODipine (NORVASC) 5 MG tablet Take 1 tablet (5 mg total) by mouth daily. 07/14/21   Zadie Rhine, MD  ?   ? ?Allergies    ?Patient has no known allergies.   ? ?Review of Systems   ?Review of Systems  ?Constitutional:  Negative for chills and fever.  ?HENT:  Negative for facial swelling and trouble swallowing.   ?Eyes:  Positive for visual disturbance. Negative for photophobia.  ?Respiratory:  Negative for cough and shortness of breath.   ?Cardiovascular:  Negative for chest pain and palpitations.  ?Gastrointestinal:  Negative for abdominal pain, nausea and vomiting.  ?Endocrine: Negative for polydipsia and  polyuria.  ?Genitourinary:  Negative for difficulty urinating and hematuria.  ?Musculoskeletal:  Negative for gait problem and joint swelling.  ?Skin:  Negative for pallor and rash.  ?Neurological:  Negative for syncope and headaches.  ?Psychiatric/Behavioral:  Negative for agitation and confusion.   ? ?Physical Exam ?Updated Vital Signs ?BP (!) 209/104 (BP Location: Right Arm)   Pulse 78   Temp 98.3 ?F (36.8 ?C)   Resp 18   Ht 6' (1.829 m)   Wt 87.1 kg   SpO2 100%   BMI 26.04 kg/m?  ?Physical Exam ?Vitals and nursing note reviewed.  ?Constitutional:   ?   General: He is not in acute distress. ?   Appearance: Normal appearance. He is well-developed.  ?HENT:  ?   Head: Normocephalic and atraumatic.  ?   Right Ear: External ear normal.  ?   Left Ear: External ear normal.  ?   Mouth/Throat:  ?   Mouth: Mucous membranes are moist.  ?Eyes:  ?   General: No scleral icterus. ?   Comments: Injection and cataract of left eye.  ?Cardiovascular:  ?   Rate and Rhythm: Normal rate and regular rhythm.  ?   Pulses: Normal pulses.  ?   Heart sounds: Normal heart sounds.  ?Pulmonary:  ?   Effort: Pulmonary effort is normal. No respiratory distress.  ?   Breath sounds: Normal breath sounds.  ?Abdominal:  ?   General: Abdomen is flat.  ?  Palpations: Abdomen is soft.  ?   Tenderness: There is no abdominal tenderness.  ?Musculoskeletal:     ?   General: Normal range of motion.  ?   Cervical back: Full passive range of motion without pain and normal range of motion.  ?   Right lower leg: No edema.  ?   Left lower leg: No edema.  ?Skin: ?   General: Skin is warm and dry.  ?   Capillary Refill: Capillary refill takes less than 2 seconds.  ?Neurological:  ?   Mental Status: He is alert and oriented to person, place, and time.  ?   GCS: GCS eye subscore is 4. GCS verbal subscore is 5. GCS motor subscore is 6.  ?   Gait: Gait normal.  ?Psychiatric:     ?   Mood and Affect: Mood normal.     ?   Behavior: Behavior normal.  ? ? ?ED  Results / Procedures / Treatments   ?Labs ?(all labs ordered are listed, but only abnormal results are displayed) ?Labs Reviewed - No data to display ? ?EKG ?None ? ?Radiology ?No results found. ? ?Procedures ?Procedures  ? ? ?Medications Ordered in ED ?Medications - No data to display ? ?ED Course/ Medical Decision Making/ A&P ?  ?                        ?Medical Decision Making ? ?Initial Impression and Ddx ?Serious etiology was considered, differential includes was not limited to medication noncompliance, corneal abrasion, syndrome ? ?Patient PMH that increases complexity of ED encounter: Homeless, medication noncompliance ? ?Interpretation of Diagnostics ?N/a ? ?Patient Reassessment and Ultimate Disposition/Management ?Physical exam is reassuring.  Patient with injection to his left sclera, he was seen in the ER 2 days ago diagnosed with abrasion.  Started on appropriate medications and given outpatient ophtho follow-up. ? ?Patient with elevated blood pressure, multiple visits in the past with similar blood pressure readings.  Patient with poor compliance with his antihypertensives.  Patient's baseline blood pressure is likely elevated, without evidence of endorgan damage or symptoms associated with hypertensive emergency; acute reduction patient blood pressure is not indicated.  Advised patient to take his amlodipine at home and to follow-up with PCP. ? ?Patient management required discussion with the following services or consulting groups:  None ? ?Complexity of Problems Addressed ?Acute uncomplicated illness or injury with no diagnostics ? ?Additional Data Reviewed and Analyzed ?Further history obtained from: ?Past medical history and medications listed in the EMR, Prior ED visit notes, and Prior labs/imaging results ? ?Patient Encounter Risk Assessment ?None ? ?Patient given homeless resources, shelter resources.  Advised to follow-up with PCP and with ophthalmology ? ?Take medications as  prescribed. ? ?Patient reports he has multiple family members locally but he is unable stay with them.  Says his daughter lives in [place] and is supposed to come back soon and he potentially can stay with her. ? ? ?The patient improved significantly and was discharged in stable condition. Detailed discussions were had with the patient regarding current findings, and need for close f/u with PCP or on call doctor. The patient has been instructed to return immediately if the symptoms worsen in any way for re-evaluation. Patient verbalized understanding and is in agreement with current care plan. All questions answered prior to discharge. ? ? ? ? ? ? ? ? ?Final Clinical Impression(s) / ED Diagnoses ?Final diagnoses:  ?Elevated blood pressure reading  ?Homeless  ?  Noncompliance with medications  ? ? ?Rx / DC Orders ?ED Discharge Orders   ? ? None  ? ?  ? ? ?  ?Sloan Leiter, DO ?07/22/21 [id number] ? ?

## 2021-07-22 NOTE — ED Notes (Signed)
Pt not present in vertical treatment area to review discharge papers. Not able to re-eval VS due to same.  ?

## 2021-07-22 NOTE — Discharge Instructions (Signed)
It was a pleasure caring for you today in the emergency department. ° °Please return to the emergency department for any worsening or worrisome symptoms. ° ° °

## 2021-07-23 ENCOUNTER — Emergency Department (HOSPITAL_COMMUNITY)
Admission: EM | Admit: 2021-07-23 | Discharge: 2021-07-23 | Disposition: A | Discharge status: Home / Self Care | Payer: Self-pay | Attending: Emergency Medicine | Admitting: Emergency Medicine

## 2021-07-23 ENCOUNTER — Encounter (HOSPITAL_COMMUNITY): Payer: Self-pay | Admitting: Emergency Medicine

## 2021-07-23 DIAGNOSIS — X58XXXA Exposure to other specified factors, initial encounter: Secondary | ICD-10-CM | POA: Insufficient documentation

## 2021-07-23 DIAGNOSIS — H5712 Ocular pain, left eye: Secondary | ICD-10-CM

## 2021-07-23 DIAGNOSIS — S0502XA Injury of conjunctiva and corneal abrasion without foreign body, left eye, initial encounter: Secondary | ICD-10-CM | POA: Insufficient documentation

## 2021-07-23 MED ORDER — TOBRAMYCIN 0.3 % OP SOLN
2.0000 [drp] | OPHTHALMIC | Status: DC
  Administered 2021-07-23: 2 [drp] via OPHTHALMIC
  Filled 2021-07-23: qty 5

## 2021-07-23 NOTE — ED Provider Notes (Signed)
?  [facility] ?Provider Note ? ? ?CSN: [id number] ?Arrival date & time: 07/23/21  0028 ? ?  ? ?History ? ?Chief Complaint  ?Patient presents with  ? Eye Pain  ? ? ?Peter Meyer is a 61 y.o. male. ? ?Patient is a 61 year old male with history of homelessness.  He is well-known to the emergency department as he presents here on nearly a daily basis.  Patient seen several days ago with complaints of an eye injury.  He was prescribed erythromycin ointment, but is unclear as to whether or not he has been using this.  He returns again with complaints of eye pain.  He was seen yesterday with the same complaint.  He denies visual disturbances.  He describes red eye with irritation. ? ?The history is provided by the patient.  ? ?  ? ?Home Medications ?Prior to Admission medications   ?Medication Sig Start Date End Date Taking? Authorizing Provider  ?amLODipine (NORVASC) 5 MG tablet Take 1 tablet (5 mg total) by mouth daily. 07/14/21   Ripley Fraise, MD  ?   ? ?Allergies    ?Patient has no known allergies.   ? ?Review of Systems   ?Review of Systems  ?All other systems reviewed and are negative. ? ?Physical Exam ?Updated Vital Signs ?BP (!) 214/116 (BP Location: Right Arm)   Pulse 76   Temp 97.9 ?F (36.6 ?C) (Oral)   Resp 18   Ht 6' (1.829 m)   Wt 87.1 kg   SpO2 100%   BMI 26.04 kg/m?  ?Physical Exam ?Vitals and nursing note reviewed.  ?Constitutional:   ?   General: He is not in acute distress. ?   Appearance: Normal appearance. He is not ill-appearing.  ?HENT:  ?   Head: Normocephalic and atraumatic.  ?Eyes:  ?   General:     ?   Right eye: No discharge.     ?   Left eye: No discharge.  ?   Pupils: Pupils are equal, round, and reactive to light.  ?   Comments: There is injection of the left sclera.  Pupil is reactive and cornea otherwise clear.  ?Pulmonary:  ?   Effort: Pulmonary effort is normal.  ?Neurological:  ?   Mental Status: He is alert and oriented to person, place, and time.  ? ? ?ED  Results / Procedures / Treatments   ?Labs ?(all labs ordered are listed, but only abnormal results are displayed) ?Labs Reviewed - No data to display ? ?EKG ?None ? ?Radiology ?No results found. ? ?Procedures ?Procedures  ? ? ?Medications Ordered in ED ?Medications - No data to display ? ?ED Course/ Medical Decision Making/ A&P ? ?Patient presenting with eye pain.  He was diagnosed with a corneal abrasion and prescribed erythromycin.  It is unclear as to whether or not he has been using this as patient is a poor historian and chronically noncompliant.  I will prescribe eyedrops. ? ?Patient is homeless and presents here frequently seeking shelter and food.  I see no emergent process today and feel as though he can be discharged. ? ?Final Clinical Impression(s) / ED Diagnoses ?Final diagnoses:  ?None  ? ? ?Rx / DC Orders ?ED Discharge Orders   ? ? None  ? ?  ? ? ?  ?Veryl Speak, MD ?07/23/21 [id number] ? ?

## 2021-07-23 NOTE — ED Triage Notes (Signed)
Pt here again tonight c/o eye pain. Pt also states he is hungry and homeless. ?

## 2021-07-23 NOTE — Discharge Instructions (Signed)
Begin using Tobrex eyedrops, 2 drops 4 times daily. ? ?Follow-up with your primary doctor. ?

## 2021-07-28 ENCOUNTER — Encounter (HOSPITAL_COMMUNITY): Payer: Self-pay

## 2021-07-28 ENCOUNTER — Other Ambulatory Visit: Payer: Self-pay

## 2021-07-28 ENCOUNTER — Emergency Department (HOSPITAL_COMMUNITY)
Admission: EM | Admit: 2021-07-28 | Discharge: 2021-07-28 | Disposition: A | Discharge status: Home / Self Care | Payer: Self-pay | Attending: Emergency Medicine | Admitting: Emergency Medicine

## 2021-07-28 DIAGNOSIS — F10929 Alcohol use, unspecified with intoxication, unspecified: Secondary | ICD-10-CM

## 2021-07-28 DIAGNOSIS — I10 Essential (primary) hypertension: Secondary | ICD-10-CM | POA: Insufficient documentation

## 2021-07-28 DIAGNOSIS — F10129 Alcohol abuse with intoxication, unspecified: Secondary | ICD-10-CM | POA: Insufficient documentation

## 2021-07-28 DIAGNOSIS — R32 Unspecified urinary incontinence: Secondary | ICD-10-CM | POA: Insufficient documentation

## 2021-07-28 DIAGNOSIS — Z79899 Other long term (current) drug therapy: Secondary | ICD-10-CM | POA: Insufficient documentation

## 2021-07-28 DIAGNOSIS — Y906 Blood alcohol level of 120-199 mg/100 ml: Secondary | ICD-10-CM | POA: Insufficient documentation

## 2021-07-28 LAB — URINALYSIS, ROUTINE W REFLEX MICROSCOPIC
Bacteria, UA: NONE SEEN
Bilirubin Urine: NEGATIVE
Glucose, UA: NEGATIVE mg/dL
Ketones, ur: NEGATIVE mg/dL
Leukocytes,Ua: NEGATIVE
Nitrite: NEGATIVE
Protein, ur: 30 mg/dL — AB
Specific Gravity, Urine: 1.002 — ABNORMAL LOW (ref 1.005–1.030)
pH: 6 (ref 5.0–8.0)

## 2021-07-28 LAB — BASIC METABOLIC PANEL
Anion gap: 10 (ref 5–15)
BUN: 15 mg/dL (ref 8–23)
CO2: 25 mmol/L (ref 22–32)
Calcium: 8.8 mg/dL — ABNORMAL LOW (ref 8.9–10.3)
Chloride: 102 mmol/L (ref 98–111)
Creatinine, Ser: 1.12 mg/dL (ref 0.61–1.24)
GFR, Estimated: >60 mL/min (ref >60)
Glucose, Bld: 93 mg/dL (ref 70–99)
Potassium: 3.7 mmol/L (ref 3.5–5.1)
Sodium: 137 mmol/L (ref 135–145)

## 2021-07-28 LAB — CBC
HCT: 42.1 % (ref 39.0–52.0)
Hemoglobin: 13.4 g/dL (ref 13.0–17.0)
MCH: 28.2 pg (ref 26.0–34.0)
MCHC: 31.8 g/dL (ref 30.0–36.0)
MCV: 88.6 fL (ref 80.0–100.0)
Platelets: 236 10*3/uL (ref 150–400)
RBC: 4.75 MIL/uL (ref 4.22–5.81)
RDW: 13.4 % (ref 11.5–15.5)
WBC: 7.2 10*3/uL (ref 4.0–10.5)
nRBC: 0 % (ref 0.0–0.2)

## 2021-07-28 LAB — TROPONIN I (HIGH SENSITIVITY): Troponin I (High Sensitivity): 21 ng/L — ABNORMAL HIGH (ref <18)

## 2021-07-28 LAB — ETHANOL: Alcohol, Ethyl (B): 149 mg/dL — ABNORMAL HIGH (ref <10)

## 2021-07-28 MED ORDER — LABETALOL HCL 5 MG/ML IV SOLN
20.0000 mg | Freq: Once | INTRAVENOUS | Status: AC
  Administered 2021-07-28: 20 mg via INTRAVENOUS
  Filled 2021-07-28: qty 4

## 2021-07-28 MED ORDER — TAMSULOSIN HCL 0.4 MG PO CAPS: 0.4000 mg | ORAL_CAPSULE | Freq: Every day | ORAL | 0 refills | Status: DC

## 2021-07-28 MED ORDER — AMLODIPINE BESYLATE 10 MG PO TABS: 10.0000 mg | ORAL_TABLET | Freq: Every day | ORAL | 0 refills | Status: DC

## 2021-07-28 MED ORDER — TAMSULOSIN HCL 0.4 MG PO CAPS: 0.4000 mg | ORAL_CAPSULE | Freq: Every day | ORAL | 0 refills | Status: AC

## 2021-07-28 NOTE — ED Provider Notes (Signed)
?[facility] ?Provider Note ? ? ?CSN: [id number] ?Arrival date & time: 07/28/21  1951 ? ?  ? ?History ? ?Chief Complaint  ?Patient presents with  ? Urinary Frequency  ? ? ?Peter Meyer is a 61 y.o. male. ? ? ?Urinary Frequency ? ? ?Patient presents to the ED complaining of urinary frequency and incontinence.  Patient states he has issues where he is unable to control his bladder.  He is urinating on himself.  The symptoms have been ongoing for months.  Patient denies any fevers or chills.  No abdominal pain.  No chest pain or shortness of breath.  Patient does have a history of hypertension but has not been taking his medications. ? ?Prior records reviewed.  Patient does have a history of homelessness.  He frequently visits the ED.  Patient was seen in the emergency room on February 25 twice, March 2 March 5 and March 6 for various complaints ? ?Patient admits to drinking beer today. ? ?Home Medications ?Prior to Admission medications   ?Medication Sig Start Date End Date Taking? Authorizing Provider  ?amLODipine (NORVASC) 10 MG tablet Take 1 tablet (10 mg total) by mouth daily. 07/28/21 08/27/21  Linwood Dibbles, MD  ?tamsulosin (FLOMAX) 0.4 MG CAPS capsule Take 1 capsule (0.4 mg total) by mouth daily. 07/28/21 08/27/21  Linwood Dibbles, MD  ?   ? ?Allergies    ?Patient has no known allergies.   ? ?Review of Systems   ?Review of Systems  ?Constitutional:  Negative for fever.  ?Genitourinary:  Positive for frequency.  ? ?Physical Exam ?Updated Vital Signs ?BP (!) 172/89 (BP Location: Left Arm)   Pulse 67   Temp 98.6 ?F (37 ?C) (Oral)   Resp 14   Ht 1.829 m (6')   Wt 88 kg   SpO2 99%   BMI 26.31 kg/m?  ?Physical Exam ?Vitals and nursing note reviewed.  ?Constitutional:   ?   General: He is not in acute distress. ?   Appearance: He is well-developed.  ?HENT:  ?   Head: Normocephalic and atraumatic.  ?   Right Ear: External ear normal.  ?   Left Ear: External ear normal.  ?Eyes:  ?   General: No scleral  icterus.    ?   Right eye: No discharge.     ?   Left eye: No discharge.  ?   Conjunctiva/sclera: Conjunctivae normal.  ?Neck:  ?   Trachea: No tracheal deviation.  ?Cardiovascular:  ?   Rate and Rhythm: Normal rate and regular rhythm.  ?Pulmonary:  ?   Effort: Pulmonary effort is normal. No respiratory distress.  ?   Breath sounds: Normal breath sounds. No stridor. No wheezing or rales.  ?Abdominal:  ?   General: Bowel sounds are normal. There is no distension.  ?   Palpations: Abdomen is soft.  ?   Tenderness: There is no abdominal tenderness. There is no guarding or rebound.  ?Genitourinary: ?   Comments: Patient is incontinent of urine ?Musculoskeletal:     ?   General: No tenderness or deformity.  ?   Cervical back: Neck supple.  ?Skin: ?   General: Skin is warm and dry.  ?   Findings: No rash.  ?Neurological:  ?   General: No focal deficit present.  ?   Mental Status: He is alert.  ?   Cranial Nerves: No cranial nerve deficit (no facial droop, extraocular movements intact, no slurred speech).  ?   Sensory:  No sensory deficit.  ?   Motor: No abnormal muscle tone or seizure activity.  ?   Coordination: Coordination normal.  ?Psychiatric:     ?   Mood and Affect: Mood normal.  ? ? ?ED Results / Procedures / Treatments   ?Labs ?(all labs ordered are listed, but only abnormal results are displayed) ?Labs Reviewed  ?URINALYSIS, ROUTINE W REFLEX MICROSCOPIC - Abnormal; Notable for the following components:  ?    Result Value  ? Color, Urine STRAW (*)   ? Specific Gravity, Urine 1.002 (*)   ? Hgb urine dipstick SMALL (*)   ? Protein, ur 30 (*)   ? All other components within normal limits  ?BASIC METABOLIC PANEL - Abnormal; Notable for the following components:  ? Calcium 8.8 (*)   ? All other components within normal limits  ?ETHANOL - Abnormal; Notable for the following components:  ? Alcohol, Ethyl (B) 149 (*)   ? All other components within normal limits  ?TROPONIN I (HIGH SENSITIVITY) - Abnormal; Notable for the  following components:  ? Troponin I (High Sensitivity) 21 (*)   ? All other components within normal limits  ?CBC  ?RAPID URINE DRUG SCREEN, HOSP PERFORMED  ? ? ?EKG ?EKG Interpretation ? ?Date/Time:  Saturday July 28 2021 21:19:05 EST ?Ventricular Rate:  78 ?PR Interval:  183 ?QRS Duration: 92 ?QT Interval:  433 ?QTC Calculation: 494 ?R Axis:   -68 ?Text Interpretation: Sinus rhythm Probable left atrial enlargement Left anterior fascicular block LVH with secondary repolarization abnormality Anterior Q waves, possibly due to LVH No significant change since last tracing Confirmed by Linwood DibblesKnapp, Dayana Dalporto [phone]) on 07/28/2021 10:33:49 PM ? ?Radiology ?No results found. ? ?Procedures ?Procedures  ? ? ?Medications Ordered in ED ?Medications  ?labetalol (NORMODYNE) injection 20 mg (20 mg Intravenous Given 07/28/21 2148)  ?labetalol (NORMODYNE) injection 20 mg (20 mg Intravenous Given 07/28/21 2247)  ? ? ?ED Course/ Medical Decision Making/ A&P ?Clinical Course as of 07/28/21 2255  ?Sat Jul 28, 2021  ?2250 Troponin elevated at 21 but similar to previous values [JK]  ?  ?Clinical Course User Index ?[JK] Linwood DibblesKnapp, Kermitt Harjo, MD  ? ?                        ?Medical Decision Making ?Amount and/or Complexity of Data Reviewed ?External Data Reviewed: notes. ?   Details: Vital signs and prior visit notes reviewed ?Labs: ordered. Decision-making details documented in ED Course. ?ECG/medicine tests: ordered. Decision-making details documented in ED Course. ? ?Risk ?Prescription drug management. ?Diagnosis or treatment significantly limited by social determinants of health. ? ? ?Patient presented with complaints of urinary incontinence.  Patient does not have evidence of renal failure.  Bladder scan was performed he does not have urinary retention.  Patient was incontinent of urine this evening but he is also intoxicated.  I suspect this could be a contributing factor.  It is possible he might have some BPH. ? ?Hypertension ?Patient has history of  hypertension.  Patient states his daughter has his medications and has not been able to take them.  Prior records reviewed and patient has longstanding uncontrolled high blood pressure.  He does not have any evidence of endorgan ischemia.  Troponin is slightly elevated but this is similar to previous values and he is not having any chest pain or shortness of breath.  Patient was given IV blood pressure medications.  Blood pressure is improved.  Will discharge home with medications for  blood pressure and prostate hypertrophy.  Recommend close follow-up with PCP. ? ? ? ? ? ? ? ?Final Clinical Impression(s) / ED Diagnoses ?Final diagnoses:  ?Hypertension, unspecified type  ?Alcoholic intoxication with complication (HCC)  ? ? ?Rx / DC Orders ?ED Discharge Orders   ? ?      Ordered  ?  amLODipine (NORVASC) 10 MG tablet  Daily       ? 07/28/21 2252  ?  tamsulosin (FLOMAX) 0.4 MG CAPS capsule  Daily,   Status:  Discontinued       ? 07/28/21 2252  ?  tamsulosin (FLOMAX) 0.4 MG CAPS capsule  Daily       ? 07/28/21 2253  ? ?  ?  ? ?  ? ? ?  ?Linwood Dibbles, MD ?07/28/21 2255 ? ?

## 2021-07-28 NOTE — Discharge Instructions (Signed)
Make sure to take your blood pressure medications.  Follow-up with your primary care doctor to review your blood pressure and check on your urinary issues.  Avoid drinking alcohol excessively ?

## 2021-07-28 NOTE — ED Triage Notes (Addendum)
Pt c/o urinary frequency, states he is unable to control his bladder.  States this has been going on for months. Denies any pain with urination or any other urinary symptoms. Pt BP elevated in triage, says he is noncompliant with medications.  ?

## 2021-07-28 NOTE — ED Notes (Signed)
Pt bladder scan showed 64 mls post void. Nurse notified ?

## 2021-07-28 NOTE — ED Notes (Signed)
Urinal emptied and placed at bedside ?

## 2021-08-07 ENCOUNTER — Emergency Department (HOSPITAL_COMMUNITY)
Admission: EM | Admit: 2021-08-07 | Discharge: 2021-08-07 | Disposition: A | Discharge status: Home / Self Care | Payer: Self-pay | Attending: Emergency Medicine | Admitting: Emergency Medicine

## 2021-08-07 ENCOUNTER — Encounter (HOSPITAL_COMMUNITY): Payer: Self-pay | Admitting: Emergency Medicine

## 2021-08-07 ENCOUNTER — Other Ambulatory Visit: Payer: Self-pay

## 2021-08-07 DIAGNOSIS — I160 Hypertensive urgency: Secondary | ICD-10-CM | POA: Insufficient documentation

## 2021-08-07 DIAGNOSIS — I10 Essential (primary) hypertension: Secondary | ICD-10-CM | POA: Insufficient documentation

## 2021-08-07 DIAGNOSIS — Z79899 Other long term (current) drug therapy: Secondary | ICD-10-CM | POA: Insufficient documentation

## 2021-08-07 MED ORDER — AMLODIPINE BESYLATE 5 MG PO TABS
10.0000 mg | ORAL_TABLET | Freq: Once | ORAL | Status: AC
  Administered 2021-08-07: 10 mg via ORAL
  Filled 2021-08-07: qty 2

## 2021-08-07 MED ORDER — AMLODIPINE BESYLATE 10 MG PO TABS: 10.0000 mg | ORAL_TABLET | Freq: Every day | ORAL | 0 refills | Status: DC

## 2021-08-07 NOTE — Discharge Instructions (Signed)
Your blood pressure medication has been sent to the pharmacy, please start taking the medication as prescribed. ?

## 2021-08-07 NOTE — ED Triage Notes (Addendum)
Pt here stating his BP was "24/26". Informed pt that this was not correct and he stated "that's what the girl who dropped me off told me it was ".  Pt states he has not been taking his BP meds because his daughter has not got them for him. ?

## 2021-08-07 NOTE — ED Provider Notes (Signed)
?[facility] EM[facility] DEPART[facility] ?Provider Note ? ? ?CSN: [id number] ?Arrival date & time: 08/07/21  2000 ? ?  ? ?History ? ?Chief Complaint  ?Patient presents with  ? Hypertension  ? ? ?Peter Meyer is a 61 y.o. male. ? ?HPI ? ?  ? ?61 year old male comes in with chief complaint of elevated blood pressure. ? ?Indicates that his BP was over 200 when he checked it, and he decided to come in to ensure everything was fine. ? ?Patient denies any chest pain, shortness of breath, headaches, focal numbness or weakness.  He indicates that he has not taken his BP medication over several months because his daughter has them.  Daughter lives in [place]. ? ?Home Medications ?Prior to Admission medications   ?Medication Sig Start Date End Date Taking? Authorizing Provider  ?amLODipine (NORVASC) 10 MG tablet Take 1 tablet (10 mg total) by mouth daily. 08/07/21 09/06/21  Derwood Kaplan, MD  ?tamsulosin (FLOMAX) 0.4 MG CAPS capsule Take 1 capsule (0.4 mg total) by mouth daily. 07/28/21 08/27/21  Linwood Dibbles, MD  ?   ? ?Allergies    ?Patient has no known allergies.   ? ?Review of Systems   ?Review of Systems  ?All other systems reviewed and are negative. ? ?Physical Exam ?Updated Vital Signs ?BP (!) 205/107   Pulse 77   Temp 98.5 ?F (36.9 ?C) (Oral)   Resp 16   Ht 6' (1.829 m)   Wt 88 kg   SpO2 99%   BMI 26.31 kg/m?  ?Physical Exam ?Vitals and nursing note reviewed.  ?Constitutional:   ?   Appearance: He is well-developed.  ?HENT:  ?   Head: Atraumatic.  ?Eyes:  ?   Extraocular Movements: Extraocular movements intact.  ?   Pupils: Pupils are equal, round, and reactive to light.  ?Cardiovascular:  ?   Rate and Rhythm: Normal rate.  ?Pulmonary:  ?   Effort: Pulmonary effort is normal.  ?Musculoskeletal:  ?   Cervical back: Neck supple.  ?Skin: ?   General: Skin is warm.  ?Neurological:  ?   Mental Status: He is alert and oriented to person, place, and time.  ?   Cranial Nerves: No cranial nerve deficit.  ?   Sensory: No sensory  deficit.  ?   Motor: No weakness.  ? ? ?ED Results / Procedures / Treatments   ?Labs ?(all labs ordered are listed, but only abnormal results are displayed) ?Labs Reviewed - No data to display ? ?EKG ?EKG Interpretation ? ?Date/Time:  Tuesday August 07 2021 22:52:32 EDT ?Ventricular Rate:  75 ?PR Interval:  172 ?QRS Duration: 94 ?QT Interval:  439 ?QTC Calculation: 491 ?R Axis:   -31 ?Text Interpretation: Sinus rhythm Probable left atrial enlargement Left ventricular hypertrophy Anterior infarct, old Abnormal T, consider ischemia, diffuse leads Nonspecific ST and T wave abnormality Confirmed by Derwood Kaplan [phone] on 08/07/2021 11:01:53 PM ? ?Radiology ?No results found. ? ?Procedures ?Procedures  ? ? ?Medications Ordered in ED ?Medications  ?amLODipine (NORVASC) tablet 10 mg (10 mg Oral Given 08/07/21 2233)  ? ? ?ED Course/ Medical Decision Making/ A&P ?  ?                        ?Medical Decision Making ?Risk ?Prescription drug management. ? ? ?This patient presents to the ED with chief complaint(s) of elevated blood pressure with pertinent past medical history of hypertension, noncompliance with medications. ? ?Pt has no chest pain, headaches, shortness  of breath - therefore no labs indicated. No imaging indicated. Will get screening ekg ? ? ?Additional history obtained: ?Records reviewed previous admission documents and previous lab workup from 07-28-21 ? ?We will give him his home dose of amlodipine. ? ? ?Final Clinical Impression(s) / ED Diagnoses ?Final diagnoses:  ?Hypertensive urgency  ? ? ?Rx / DC Orders ?ED Discharge Orders   ? ?      Ordered  ?  amLODipine (NORVASC) 10 MG tablet  Daily       ? 08/07/21 2303  ? ?  ?  ? ?  ? ? ?  ?Derwood Kaplan, MD ?08/09/21 2056 ? ?

## 2021-08-09 ENCOUNTER — Encounter (HOSPITAL_COMMUNITY): Payer: Self-pay | Admitting: Emergency Medicine

## 2021-08-09 ENCOUNTER — Emergency Department (HOSPITAL_COMMUNITY)
Admission: EM | Admit: 2021-08-09 | Discharge: 2021-08-09 | Disposition: A | Discharge status: Home / Self Care | Payer: Self-pay | Attending: Emergency Medicine | Admitting: Emergency Medicine

## 2021-08-09 DIAGNOSIS — I10 Essential (primary) hypertension: Secondary | ICD-10-CM | POA: Insufficient documentation

## 2021-08-09 DIAGNOSIS — T730XXA Starvation, initial encounter: Secondary | ICD-10-CM | POA: Insufficient documentation

## 2021-08-09 DIAGNOSIS — Z79899 Other long term (current) drug therapy: Secondary | ICD-10-CM | POA: Insufficient documentation

## 2021-08-09 LAB — BASIC METABOLIC PANEL
Anion gap: 10 (ref 5–15)
BUN: 16 mg/dL (ref 8–23)
CO2: 24 mmol/L (ref 22–32)
Calcium: 8.5 mg/dL — ABNORMAL LOW (ref 8.9–10.3)
Chloride: 101 mmol/L (ref 98–111)
Creatinine, Ser: 1.09 mg/dL (ref 0.61–1.24)
GFR, Estimated: >60 mL/min (ref >60)
Glucose, Bld: 95 mg/dL (ref 70–99)
Potassium: 3.6 mmol/L (ref 3.5–5.1)
Sodium: 135 mmol/L (ref 135–145)

## 2021-08-09 MED ORDER — AMLODIPINE BESYLATE 5 MG PO TABS
10.0000 mg | ORAL_TABLET | Freq: Once | ORAL | Status: AC
  Administered 2021-08-09: 10 mg via ORAL
  Filled 2021-08-09: qty 2

## 2021-08-09 NOTE — ED Triage Notes (Signed)
Pt c/o continued hypertension due to not taking his HTN meds. Pt seen here for same several times. Pt continues to states that daughter has not paid for him to get his medication filled. ?

## 2021-08-09 NOTE — Discharge Instructions (Signed)
Your labs were reassuring. You need to start taking your blood pressure medication. Please return if worsening symptoms.  ?

## 2021-08-09 NOTE — ED Provider Notes (Signed)
?[place] EMERGENCY DEPARTMENT ?Provider Note ? ? ?CSN: [id number] ?Arrival date & time: 08/09/21  1913 ? ?  ? ?History ?PMH of left cataract ?Chief Complaint  ?Patient presents with  ? Hypertension  ? ? ?Peter Meyer is a 61 y.o. male.  Patient with chief complaint of high blood pressure.  Patient has been seen frequently in this ED for the same complaint.  He is supposed to be on amlodipine, however states his daughter has not filled the medication to give to him.  He denies any new changes in his feelings.  He denies any headache, chest pain, shortness of breath, abdominal pain, nausea, vomiting, dizziness, visual changes, gait abnormalities.  States that he is hungry and would like food. ? ? ?Hypertension ? ? ?  ? ?Home Medications ?Prior to Admission medications   ?Medication Sig Start Date End Date Taking? Authorizing Provider  ?amLODipine (NORVASC) 10 MG tablet Take 1 tablet (10 mg total) by mouth daily. 08/07/21 09/06/21  Derwood Kaplan, MD  ?tamsulosin (FLOMAX) 0.4 MG CAPS capsule Take 1 capsule (0.4 mg total) by mouth daily. 07/28/21 08/27/21  Linwood Dibbles, MD  ?   ? ?Allergies    ?Patient has no known allergies.   ? ?Review of Systems   ?Review of Systems  ?All other systems reviewed and are negative. ? ?Physical Exam ?Updated Vital Signs ?BP (!) 213/116   Pulse 84   Temp 98.9 ?F (37.2 ?C) (Oral)   Resp 19   Ht 6' (1.829 m)   Wt 88 kg   SpO2 95%   BMI 26.31 kg/m?  ?Physical Exam ?Vitals and nursing note reviewed.  ?Constitutional:   ?   General: He is not in acute distress. ?   Appearance: Normal appearance. He is not ill-appearing, toxic-appearing or diaphoretic.  ?HENT:  ?   Head: Normocephalic and atraumatic.  ?   Nose: No nasal deformity.  ?   Mouth/Throat:  ?   Lips: Pink. No lesions.  ?   Mouth: Mucous membranes are moist. No injury, lacerations, oral lesions or angioedema.  ?   Pharynx: Oropharynx is clear. Uvula midline. No pharyngeal swelling, oropharyngeal exudate, posterior oropharyngeal  erythema or uvula swelling.  ?Eyes:  ?   General: Gaze aligned appropriately. No scleral icterus.    ?   Right eye: No discharge.     ?   Left eye: No discharge.  ?   Extraocular Movements: Extraocular movements intact.  ?   Conjunctiva/sclera: Conjunctivae normal.  ?   Right eye: Right conjunctiva is not injected. No exudate or hemorrhage. ?   Left eye: Left conjunctiva is not injected. No exudate or hemorrhage. ?   Pupils: Pupils are equal, round, and reactive to light.  ?   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   Rate and Rhythm: Normal rate and regular rhythm.  ?   Pulses: Normal pulses.     ?     Radial pulses are 2+ on the right side and 2+ on the left side.  ?     Dorsalis pedis pulses are 2+ on the right side and 2+ on the left side.  ?   Heart sounds: Normal heart sounds, S1 normal and S2 normal. Heart sounds not distant. No murmur heard. ?  No friction rub. No gallop. No S3 or S4 sounds.  ?Pulmonary:  ?   Effort: Pulmonary effort is normal. No accessory muscle usage or respiratory distress.  ?  Breath sounds: Normal breath sounds. No stridor. No wheezing, rhonchi or rales.  ?Chest:  ?   Chest wall: No tenderness.  ?Abdominal:  ?   General: Abdomen is flat. Bowel sounds are normal. There is no distension.  ?   Palpations: Abdomen is soft. There is no mass or pulsatile mass.  ?   Tenderness: There is no abdominal tenderness. There is no guarding or rebound.  ?Musculoskeletal:  ?   Right lower leg: No edema.  ?   Left lower leg: No edema.  ?Skin: ?   General: Skin is warm and dry.  ?   Coloration: Skin is not jaundiced or pale.  ?   Findings: No bruising, erythema, lesion or rash.  ?Neurological:  ?   General: No focal deficit present.  ?   Mental Status: He is alert and oriented to person, place, and time.  ?   GCS: GCS eye subscore is 4. GCS verbal subscore is 5. GCS motor subscore is 6.  ?Psychiatric:     ?   Mood and Affect: Mood normal.     ?   Behavior:  Behavior normal. Behavior is cooperative.  ? ? ?ED Results / Procedures / Treatments   ?Labs ?(all labs ordered are listed, but only abnormal results are displayed) ?Labs Reviewed  ?BASIC METABOLIC PANEL - Abnormal; Notable for the following components:  ?    Result Value  ? Calcium 8.5 (*)   ? All other components within normal limits  ? ? ?EKG ?EKG Interpretation ? ?Date/Time:  Thursday August 09 2021 22:22:30 EDT ?Ventricular Rate:  77 ?PR Interval:  176 ?QRS Duration: 91 ?QT Interval:  430 ?QTC Calculation: 487 ?R Axis:   -58 ?Text Interpretation: Sinus rhythm Probable left atrial enlargement Left anterior fascicular block LVH with secondary repolarization abnormality no significant change since Aug 07 2021 Confirmed by Pricilla Loveless [phone]) on 08/09/2021 10:28:02 PM ? ?Radiology ?No results found. ? ?Procedures ?Procedures  ? ?Medications Ordered in ED ?Medications  ?amLODipine (NORVASC) tablet 10 mg (10 mg Oral Given 08/09/21 2223)  ? ? ?ED Course/ Medical Decision Making/ A&P ?  ?                        ?Medical Decision Making ?Amount and/or Complexity of Data Reviewed ?Labs: ordered. ? ?Risk ?Prescription drug management. ? ? ? ?MDM  ?This is a 61 y.o. male who presents to the ED with hypertension ?The differential of this patient includes but is not limited to hypertensive urgency, hypertensive emergency ? ?My Impression, Plan, and ED Course: Patient with blood pressure up to 206/112.  He is otherwise hemodynamically stable.  He is completely asymptomatic at this time.  He does state that he feels tired so I will order a BMP to assess for kidney function.  No reason to obtain troponins or head CT at this time.  I reviewed past records he has been seen numerous times for the same complaint.  He has been very noncompliant with his medications.  I do not suspect this is a new elevation of blood pressure.  He also is homeless and frequently comes to the ED for food and shelter. ? ?I personally ordered,  reviewed, and interpreted all laboratory work and imaging and agree with radiologist interpretation. Results interpreted below: BMP with no kidney dysfunction ? ?This patient was given Amlodipine dose here in ED. I do not think that we need to give further medication to manage this as  it is a chronic issue and he is not having acute exacerbation. No evidence of end organ damage. He really just needs to start taking home medication. This may be difficult due to social factors. Recommend discharge at this time. ? ? ?Charting Requirements ?Additional history is obtained from:  Independent historian ?External Records from outside source obtained and reviewed including: previous ED visits ?Social Determinants of Health:  homeless ?Pertinant PMH that complicates patient's illness: HTN, homeless ? ?Patient Care ?Problems that were addressed during this visit: ?- HTN: Chronic illness ?This patient was maintained on a cardiac monitor/telemetry. I personally viewed and interpreted the cardiac monitor which reveals an underlying rhythm of NSR ?Medications given in ED: Amlodipine ?Reevaluation of the patient after these medicines showed that the patient stayed the same ?Disposition: discharge, start taking meds ? ?This is a supervised visit with my attending physician, Dr. Criss AlvineGoldston. We have discussed this patient and they have altered the plan as needed. ? ?Portions of this note were generated with Scientist, clinical (histocompatibility and immunogenetics)Dragon dictation software. Dictation errors may occur despite best attempts at proofreading. ?  ?Final Clinical Impression(s) / ED Diagnoses ?Final diagnoses:  ?Primary hypertension  ? ? ?Rx / DC Orders ?ED Discharge Orders   ? ? None  ? ?  ? ? ?  ?Claudie LeachLoeffler, [place] C, PA-C ?08/09/21 2343 ? ?  ?Pricilla LovelessGoldston, Scott, MD ?08/11/21 2039 ? ?

## 2021-08-11 ENCOUNTER — Other Ambulatory Visit: Payer: Self-pay

## 2021-08-11 ENCOUNTER — Emergency Department (HOSPITAL_COMMUNITY): Payer: Self-pay

## 2021-08-11 ENCOUNTER — Emergency Department (HOSPITAL_COMMUNITY)
Admission: EM | Admit: 2021-08-11 | Discharge: 2021-08-11 | Disposition: A | Discharge status: Home / Self Care | Payer: Self-pay | Attending: Emergency Medicine | Admitting: Emergency Medicine

## 2021-08-11 ENCOUNTER — Encounter (HOSPITAL_COMMUNITY): Payer: Self-pay

## 2021-08-11 DIAGNOSIS — Z79899 Other long term (current) drug therapy: Secondary | ICD-10-CM | POA: Insufficient documentation

## 2021-08-11 DIAGNOSIS — I10 Essential (primary) hypertension: Secondary | ICD-10-CM | POA: Insufficient documentation

## 2021-08-11 DIAGNOSIS — W01198A Fall on same level from slipping, tripping and stumbling with subsequent striking against other object, initial encounter: Secondary | ICD-10-CM | POA: Insufficient documentation

## 2021-08-11 DIAGNOSIS — S0003XA Contusion of scalp, initial encounter: Secondary | ICD-10-CM | POA: Insufficient documentation

## 2021-08-11 MED ORDER — ACETAMINOPHEN 500 MG PO TABS
1000.0000 mg | ORAL_TABLET | Freq: Once | ORAL | Status: AC
  Administered 2021-08-11: 1000 mg via ORAL
  Filled 2021-08-11: qty 2

## 2021-08-11 NOTE — ED Provider Notes (Signed)
? EMERGENCY DEPARTMENT ?Provider Note ? ? ?CSN: [id number] ?Arrival date & time: 08/11/21  1935 ? ?  ? ?History ? ?Chief Complaint  ?Patient presents with  ? Headache  ? ? ?Peter Meyer is a 61 y.o. male. ? ?Pt is a 61 yo homeless man with a pmhx significant for htn.  He does not take his meds and is in the ED frequently.  This is his 7th visit this month.  Pt presents to the ED today with a headache.  He said he tripped and hit the back of his head on a snack machine.  No loc.  He does not feel dizzy.  He is not on blood thinners. ? ? ?  ? ?Home Medications ?Prior to Admission medications   ?Medication Sig Start Date End Date Taking? Authorizing Provider  ?amLODipine (NORVASC) 10 MG tablet Take 1 tablet (10 mg total) by mouth daily. 08/07/21 09/06/21  Derwood Kaplan, MD  ?tamsulosin (FLOMAX) 0.4 MG CAPS capsule Take 1 capsule (0.4 mg total) by mouth daily. 07/28/21 08/27/21  Linwood Dibbles, MD  ?   ? ?Allergies    ?Patient has no known allergies.   ? ?Review of Systems   ?Review of Systems  ?Neurological:  Positive for headaches.  ?All other systems reviewed and are negative. ? ?Physical Exam ?Updated Vital Signs ?BP (!) 217/112   Pulse 80   Temp 98.1 ?F (36.7 ?C)   Resp 18   Ht 6' (1.829 m)   Wt 88 kg   SpO2 100%   BMI 26.31 kg/m?  ?Physical Exam ?Vitals and nursing note reviewed.  ?Constitutional:   ?   Appearance: He is well-developed.  ?HENT:  ?   Head: Normocephalic and atraumatic.  ?   Comments: Pt has tenderness to his posterior scalp where he said he hit the snack machine.  No signs of trauma. ?   Mouth/Throat:  ?   Mouth: Mucous membranes are moist.  ?   Pharynx: Oropharynx is clear.  ?Eyes:  ?   Comments: Left eye blind  ?Cardiovascular:  ?   Rate and Rhythm: Normal rate and regular rhythm.  ?Pulmonary:  ?   Effort: Pulmonary effort is normal.  ?   Breath sounds: Normal breath sounds.  ?Abdominal:  ?   General: Bowel sounds are normal.  ?   Palpations: Abdomen is soft.  ?Musculoskeletal:      ?   General: Normal range of motion.  ?   Cervical back: Normal range of motion and neck supple.  ?Skin: ?   General: Skin is warm.  ?Neurological:  ?   Mental Status: He is alert and oriented to person, place, and time.  ?Psychiatric:     ?   Mood and Affect: Mood normal.     ?   Behavior: Behavior normal.  ? ? ?ED Results / Procedures / Treatments   ?Labs ?(all labs ordered are listed, but only abnormal results are displayed) ?Labs Reviewed - No data to display ? ?EKG ?None ? ?Radiology ?CT Head Wo Contrast ? ?Result Date: 08/11/2021 ?CLINICAL DATA:  Status post fall with headache. EXAM: CT HEAD WITHOUT CONTRAST TECHNIQUE: Contiguous axial images were obtained from the base of the skull through the vertex without intravenous contrast. RADIATION DOSE REDUCTION: This exam was performed according to the departmental dose-optimization program which includes automated exposure control, adjustment of the mA and/or kV according to patient size and/or use of iterative reconstruction technique. COMPARISON:  August 02, 2020 FINDINGS: Brain:  There is mild cerebral atrophy with widening of the extra-axial spaces and ventricular dilatation. There are areas of decreased attenuation within the white matter tracts of the supratentorial brain, consistent with microvascular disease changes. Chronic bilateral basal ganglia lacunar infarcts are seen. Vascular: No hyperdense vessel or unexpected calcification. Skull: Normal. Negative for fracture or focal lesion. Sinuses/Orbits: Chronic deformities are seen along the medial walls of the bilateral orbits. Very mild right-sided frontal sinus mucosal thickening is noted. Other: None. IMPRESSION: 1. No acute intracranial abnormality. 2. Chronic bilateral basal ganglia lacunar infarcts. 3. Very mild right-sided frontal sinus disease. Electronically Signed   By: Aram Candelahaddeus  Houston M.D.   On: 08/11/2021 22:00  ? ?CT Cervical Spine Wo Contrast ? ?Result Date: 08/11/2021 ?CLINICAL DATA:  Status  post fall. EXAM: CT CERVICAL SPINE WITHOUT CONTRAST TECHNIQUE: Multidetector CT imaging of the cervical spine was performed without intravenous contrast. Multiplanar CT image reconstructions were also generated. RADIATION DOSE REDUCTION: This exam was performed according to the departmental dose-optimization program which includes automated exposure control, adjustment of the mA and/or kV according to patient size and/or use of iterative reconstruction technique. COMPARISON:  None. FINDINGS: Alignment: Normal. Skull base and vertebrae: No acute fracture. No primary bone lesion or focal pathologic process. Soft tissues and spinal canal: No prevertebral fluid or swelling. No visible canal hematoma. Disc levels: Moderate marked severity anterior osteophyte formation is seen at the levels of C4-C5, C5-C6 and C6-C7. There is mild narrowing of the anterior atlantoaxial articulation. Vertebral body height is preserved throughout the remainder of the cervical spine. Bilateral moderate severity multilevel facet joint hypertrophy is noted. Upper chest: Negative. Other: Multiple small shrapnel fragments are seen within the para mandibular soft tissues on the left. IMPRESSION: 1. No acute fracture or subluxation in the cervical spine. 2. Moderate severity degenerative changes at the levels of C4-C5, C5-C6 and C6-C7. 3. Multiple small shrapnel fragments are seen within the para mandibular soft tissues on the left. Electronically Signed   By: Aram Candelahaddeus  Houston M.D.   On: 08/11/2021 22:03   ? ?Procedures ?Procedures  ? ? ?Medications Ordered in ED ?Medications  ?acetaminophen (TYLENOL) tablet 1,000 mg (1,000 mg Oral Given 08/11/21 2205)  ? ? ?ED Course/ Medical Decision Making/ A&P ?  ?                        ?Medical Decision Making ?Risk ?OTC drugs. ? ? ?This patient presents to the ED for concern of headache s/p trauma, this involves an extensive number of treatment options, and is a complaint that carries with it a high risk of  complications and morbidity.  The differential diagnosis includes ich, head contusion ? ? ?Co morbidities that complicate the patient evaluation ? ?htn ? ? ?Additional history obtained: ? ?Additional history obtained from epic chart review ? ? ? ?Imaging Studies ordered: ? ?I ordered imaging studies including ct head/c-spine  ?I independently visualized and interpreted imaging which showed  ?  ?IMPRESSION:  ?1. No acute fracture or subluxation in the cervical spine.  ?2. Moderate severity degenerative changes at the levels of C4-C5,  ?C5-C6 and C6-C7.  ?3. Multiple small shrapnel fragments are seen within the para  ?mandibular soft tissues on the left.  ?CT head: ?  ?IMPRESSION:  ?1. No acute intracranial abnormality.  ?2. Chronic bilateral basal ganglia lacunar infarcts.  ?3. Very mild right-sided frontal sinus disease.  ? ?I agree with the radiologist interpretation ? ?Medicines ordered and prescription drug management: ? ?  I ordered medication including tylenol  for pain  ?Reevaluation of the patient after these medicines showed that the patient stayed the same ?I have reviewed the patients home medicines and have made adjustments as needed ? ? ?Problem List / ED Course: ? ?Head trauma:  no intracranial injury.   ?Htn:  Chronic.  Pt does not take his meds.  No cp or sob.  Headache from fall, not bp. ? ? ?Reevaluation: ? ?After the interventions noted above, I reevaluated the patient and found that they have :improved ? ? ?Social Determinants of Health: ? ?homeless ? ? ?Dispostion: ? ?After consideration of the diagnostic results and the patients response to treatment, I feel that the patent would benefit from discharge.  Return if worse.  ? ? ? ? ? ? ? ?Final Clinical Impression(s) / ED Diagnoses ?Final diagnoses:  ?Hypertension, unspecified type  ?Contusion of scalp, initial encounter  ? ? ?Rx / DC Orders ?ED Discharge Orders   ? ? None  ? ?  ? ? ?  ?Jacalyn Lefevre, MD ?08/11/21 2237 ? ?

## 2021-08-11 NOTE — ED Triage Notes (Signed)
Pt c/o headache. States he tripped and fell today hitting the back of his head on a snack machine.  ?

## 2021-08-13 ENCOUNTER — Encounter (HOSPITAL_COMMUNITY): Payer: Self-pay | Admitting: Emergency Medicine

## 2021-08-13 ENCOUNTER — Emergency Department (HOSPITAL_COMMUNITY)
Admission: EM | Admit: 2021-08-13 | Discharge: 2021-08-14 | Disposition: A | Discharge status: Home / Self Care | Payer: Self-pay | Attending: Emergency Medicine | Admitting: Emergency Medicine

## 2021-08-13 ENCOUNTER — Other Ambulatory Visit: Payer: Self-pay

## 2021-08-13 DIAGNOSIS — G4489 Other headache syndrome: Secondary | ICD-10-CM | POA: Insufficient documentation

## 2021-08-13 DIAGNOSIS — W01198A Fall on same level from slipping, tripping and stumbling with subsequent striking against other object, initial encounter: Secondary | ICD-10-CM | POA: Insufficient documentation

## 2021-08-13 DIAGNOSIS — I10 Essential (primary) hypertension: Secondary | ICD-10-CM | POA: Insufficient documentation

## 2021-08-13 DIAGNOSIS — Z79899 Other long term (current) drug therapy: Secondary | ICD-10-CM | POA: Insufficient documentation

## 2021-08-13 DIAGNOSIS — R202 Paresthesia of skin: Secondary | ICD-10-CM

## 2021-08-13 DIAGNOSIS — Y900 Blood alcohol level of less than 20 mg/100 ml: Secondary | ICD-10-CM | POA: Insufficient documentation

## 2021-08-13 NOTE — ED Triage Notes (Addendum)
Pt states he has had a headache since last night and four fingers on R hand are numb. States he "knows my BP is high".  ?

## 2021-08-14 ENCOUNTER — Telehealth: Payer: Self-pay

## 2021-08-14 LAB — CBC WITH DIFFERENTIAL/PLATELET
Abs Immature Granulocytes: 0.01 10*3/uL (ref 0.00–0.07)
Basophils Absolute: 0 10*3/uL (ref 0.0–0.1)
Basophils Relative: 1 %
Eosinophils Absolute: 0.1 10*3/uL (ref 0.0–0.5)
Eosinophils Relative: 2 %
HCT: 39.6 % (ref 39.0–52.0)
Hemoglobin: 12.7 g/dL — ABNORMAL LOW (ref 13.0–17.0)
Immature Granulocytes: 0 %
Lymphocytes Relative: 41 %
Lymphs Abs: 2.2 10*3/uL (ref 0.7–4.0)
MCH: 28.3 pg (ref 26.0–34.0)
MCHC: 32.1 g/dL (ref 30.0–36.0)
MCV: 88.4 fL (ref 80.0–100.0)
Monocytes Absolute: 0.7 10*3/uL (ref 0.1–1.0)
Monocytes Relative: 13 %
Neutro Abs: 2.4 10*3/uL (ref 1.7–7.7)
Neutrophils Relative %: 43 %
Platelets: 195 10*3/uL (ref 150–400)
RBC: 4.48 MIL/uL (ref 4.22–5.81)
RDW: 13.6 % (ref 11.5–15.5)
WBC: 5.3 10*3/uL (ref 4.0–10.5)
nRBC: 0 % (ref 0.0–0.2)

## 2021-08-14 LAB — BASIC METABOLIC PANEL
Anion gap: 7 (ref 5–15)
BUN: 16 mg/dL (ref 8–23)
CO2: 24 mmol/L (ref 22–32)
Calcium: 8.5 mg/dL — ABNORMAL LOW (ref 8.9–10.3)
Chloride: 105 mmol/L (ref 98–111)
Creatinine, Ser: 1.13 mg/dL (ref 0.61–1.24)
GFR, Estimated: >60 mL/min (ref >60)
Glucose, Bld: 88 mg/dL (ref 70–99)
Potassium: 3.4 mmol/L — ABNORMAL LOW (ref 3.5–5.1)
Sodium: 136 mmol/L (ref 135–145)

## 2021-08-14 LAB — ETHANOL: Alcohol, Ethyl (B): 16 mg/dL — ABNORMAL HIGH (ref <10)

## 2021-08-14 MED ORDER — AMLODIPINE BESYLATE 5 MG PO TABS
10.0000 mg | ORAL_TABLET | Freq: Once | ORAL | Status: AC
  Administered 2021-08-14: 10 mg via ORAL
  Filled 2021-08-14: qty 2

## 2021-08-14 MED ORDER — POTASSIUM CHLORIDE CRYS ER 20 MEQ PO TBCR
40.0000 meq | EXTENDED_RELEASE_TABLET | Freq: Once | ORAL | Status: AC
  Administered 2021-08-14: 40 meq via ORAL
  Filled 2021-08-14: qty 2

## 2021-08-14 MED ORDER — ACETAMINOPHEN 325 MG PO TABS
650.0000 mg | ORAL_TABLET | Freq: Once | ORAL | Status: AC
Start: 2021-08-14 — End: 2021-08-14
  Administered 2021-08-14: 650 mg via ORAL
  Filled 2021-08-14: qty 2

## 2021-08-14 NOTE — ED Notes (Signed)
Pt ambulates in without difficulty. ?

## 2021-08-14 NOTE — Telephone Encounter (Signed)
RNCM received TOC consult for PCP, post patient discharge. RNCM called patient, no answer unable to leave voicemail message. AVS indicates follow up with [facility] [address] [place] phone# [phone] ?

## 2021-08-14 NOTE — ED Provider Notes (Signed)
?[place] EMERGENCY DEPARTMENT ?Provider Note ? ? ?CSN: [id number] ?Arrival date & time: 08/13/21  2058 ? ?  ? ?History ? ?Chief Complaint  ?Patient presents with  ? Headache  ? Hypertension  ? ? ?Peter Meyer is a 61 y.o. male. ? ?The history is provided by the patient.  ?Headache ?Pain location:  Generalized ?Chronicity:  Recurrent ?Relieved by:  Nothing ?Worsened by:  Nothing ?Associated symptoms: numbness   ?Associated symptoms: no abdominal pain, no fever, no vomiting and no weakness   ?Hypertension ?Associated symptoms include headaches. Pertinent negatives include no abdominal pain.  ?Patient presents for headache.  He reports he fell recently and hit his head and continues to have pain.  He reports diffuse headache.  No new weakness.  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  He has not been taking his blood pressure medicines.  He also reports numbness on his second third and fourth fingers.  No focal weakness. ?  ? ?Home Medications ?Prior to Admission medications   ?Medication Sig Start Date End Date Taking? Authorizing Provider  ?amLODipine (NORVASC) 10 MG tablet Take 1 tablet (10 mg total) by mouth daily. 08/07/21 09/06/21  Derwood Kaplan, MD  ?tamsulosin (FLOMAX) 0.4 MG CAPS capsule Take 1 capsule (0.4 mg total) by mouth daily. 07/28/21 08/27/21  Linwood Dibbles, MD  ?   ? ?Allergies    ?Patient has no known allergies.   ? ?Review of Systems   ?Review of Systems  ?Constitutional:  Negative for fever.  ?Eyes:  Negative for visual disturbance.  ?Gastrointestinal:  Negative for abdominal pain and vomiting.  ?Neurological:  Positive for numbness and headaches. Negative for weakness.  ? ?Physical Exam ?Updated Vital Signs ?BP (!) 213/116   Pulse 73   Temp 98.7 ?F (37.1 ?C) (Oral)   Resp 18   Ht 1.829 m (6')   Wt 88 kg   SpO2 100%   BMI 26.31 kg/m?  ?Physical Exam ?CONSTITUTIONAL: Disheveled, no acute distress ?HEAD: Normocephalic/atraumatic ?EYES: Blind in left eye. ?ENMT: Mucous  membranes moist ?NECK: supple no meningeal signs ?CV: S1/S2 noted, no murmurs/rubs/gallops noted ?LUNGS: Lungs are clear to auscultation bilaterally, no apparent distress ?ABDOMEN: soft, nontender ?NEURO:Awake/alert, face symmetric, no arm or leg drift is noted ?Equal 5/5 strength with shoulder abduction, elbow flex/extension, wrist flex/extension in upper extremities and equal hand grips bilaterally ?Equal 5/5 strength with hip flexion,knee flex/extension, foot dorsi/plantar flexion ?Cranial nerves 3/4/5/6/11/25/08/11/12 tested and intact ?Patient reports numbness only to the second third and fourth fingers on right hand, no other sensory deficit noted ?EXTREMITIES: pulses normal, full ROM ?SKIN: warm, color normal ?PSYCH: no abnormalities of mood noted ? ?ED Results / Procedures / Treatments   ?Labs ?(all labs ordered are listed, but only abnormal results are displayed) ?Labs Reviewed  ?BASIC METABOLIC PANEL - Abnormal; Notable for the following components:  ?    Result Value  ? Potassium 3.4 (*)   ? Calcium 8.5 (*)   ? All other components within normal limits  ?CBC WITH DIFFERENTIAL/PLATELET - Abnormal; Notable for the following components:  ? Hemoglobin 12.7 (*)   ? All other components within normal limits  ?ETHANOL - Abnormal; Notable for the following components:  ? Alcohol, Ethyl (B) 16 (*)   ? All other components within normal limits  ? ? ?EKG ?EKG Interpretation ? ?Date/Time:  Monday August 13 2021 21:28:01 EDT ?Ventricular Rate:  79 ?PR Interval:  166 ?QRS Duration: 96 ?QT Interval:  408 ?  QTC Calculation: 467 ?R Axis:   -21 ?Text Interpretation: Normal sinus rhythm Left ventricular hypertrophy with repolarization abnormality ( R in aVL , Sokolow-Lyon , Cornell product ) Cannot rule out Septal infarct , age undetermined Abnormal ECG When compared with ECG of 09-Aug-2021 22:22, No significant change since last tracing Confirmed by Meridee Score [phone]) on 08/13/2021 9:53:26 PM ? ?Radiology ?No results  found. ? ?Procedures ?Procedures  ? ? ?Medications Ordered in ED ?Medications  ?amLODipine (NORVASC) tablet 10 mg (10 mg Oral Given 08/14/21 0100)  ?acetaminophen (TYLENOL) tablet 650 mg (650 mg Oral Given 08/14/21 0059)  ?potassium chloride SA (KLOR-CON M) CR tablet 40 mEq (40 mEq Oral Given 08/14/21 0155)  ? ? ?ED Course/ Medical Decision Making/ A&P ?Clinical Course as of 08/14/21 0559  ?Tue Aug 14, 2021  ?0142 Potassium(!): 3.4 ?Mild hypokalemia [DW]  ?0148 Patient presents for 19th ER visit in 6 months ?Typically presents with elevated blood pressure and headache.  On my exam he has no focal neurodeficits.  Reports numbness to fingers only but has equal grip in both hands.  I have very low suspicion for acute neurologic emergency at this time [DW]  ?  ?Clinical Course User Index ?[DW] Zadie Rhine, MD  ? ?                        ?Medical Decision Making ?Amount and/or Complexity of Data Reviewed ?Labs: ordered. Decision-making details documented in ED Course. ? ?Risk ?OTC drugs. ?Prescription drug management. ? ? ?This patient presents to the ED for concern of headache and hypertension, this involves an extensive number of treatment options, and is a complaint that carries with it a high risk of complications and morbidity.  The differential diagnosis includes migraine headache, CVA, intracranial hemorrhage, meningitis, subarachnoid hemorrhage ? ?Comorbidities that complicate the patient evaluation: ?Patient?s presentation is complicated by their history of alcohol use disorder ? ?Social Determinants of Health: ?Patient?s lack of prescription access, unhoused, and impaired access to primary care  increases the complexity of managing their presentation ? ? ?Lab Tests: ?I Ordered, and personally interpreted labs.  The pertinent results include: Mild hypokalemia, elevated alcohol level ? ?Imaging Studies ordered: ?CT imaging results from previous ED visit after fall were reviewed and negative ? ? ?Medicines  ordered and prescription drug management: ?I ordered medication including Norvasc for blood pressure ?Reevaluation of the patient after these medicines showed that the patient    stayed the same ? ? ?Consultations Obtained: ?I requested consultation with the consultant transition of care , in an attempt to establish PCP for patient ? ?After the interventions noted above, I reevaluated the patient and found that they have :stayed the same ? ?Complexity of problems addressed: ?Patient?s presentation is most consistent with  acute complicated illness/injury requiring diagnostic workup ? ?Disposition: ?After consideration of the diagnostic results and the patient?s response to treatment,  ?I feel that the patent would benefit from discharge   .  ? ? ?Patient presented for 19th ER visit in 6 months.  He frequently arrives for evaluation of high blood pressure.  His BPs are consistently elevated on every ER visit.  He claims he is waiting on his daughter to give his medicines but this never appears to materialize.  He was in no acute distress and ambulate without difficulty.  He had no focal weakness on exam.  No signs of acute hypertensive emergency or neurologic catastrophe.  Patient will be discharged  ? ? ? ? ? ? ?  Final Clinical Impression(s) / ED Diagnoses ?Final diagnoses:  ?Primary hypertension  ?Other headache syndrome  ?Paresthesia  ? ? ?Rx / DC Orders ?ED Discharge Orders   ? ? None  ? ?  ? ? ?  ?Zadie RhineWickline, [place], MD ?08/14/21 [id number] ? ?

## 2021-08-15 ENCOUNTER — Encounter (HOSPITAL_COMMUNITY): Payer: Self-pay

## 2021-08-15 ENCOUNTER — Emergency Department (HOSPITAL_COMMUNITY): Payer: Self-pay

## 2021-08-15 ENCOUNTER — Other Ambulatory Visit: Payer: Self-pay

## 2021-08-15 ENCOUNTER — Emergency Department (HOSPITAL_COMMUNITY)
Admission: EM | Admit: 2021-08-15 | Discharge: 2021-08-15 | Disposition: A | Discharge status: Home / Self Care | Payer: Self-pay | Attending: Student | Admitting: Student

## 2021-08-15 DIAGNOSIS — M79604 Pain in right leg: Secondary | ICD-10-CM | POA: Insufficient documentation

## 2021-08-15 DIAGNOSIS — Z79899 Other long term (current) drug therapy: Secondary | ICD-10-CM | POA: Insufficient documentation

## 2021-08-15 DIAGNOSIS — I10 Essential (primary) hypertension: Secondary | ICD-10-CM | POA: Insufficient documentation

## 2021-08-15 MED ORDER — IBUPROFEN 400 MG PO TABS
600.0000 mg | ORAL_TABLET | Freq: Once | ORAL | Status: AC
  Administered 2021-08-15: 600 mg via ORAL
  Filled 2021-08-15: qty 2

## 2021-08-15 NOTE — Discharge Instructions (Signed)
You were seen in the emergency department today for right leg pain.  Pain particularly behind your knee.  Your x-ray was normal.  I have placed an order for you to have an ultrasound of your leg tomorrow morning.  This is to make sure that you do not have a clot in your leg.  Please return for any worsening symptoms. ?

## 2021-08-15 NOTE — ED Provider Notes (Signed)
?[facility] ?Provider Note ? ? ?CSN: [id number] ?Arrival date & time: 08/15/21  1912 ? ?  ? ?History ? ?Chief Complaint  ?Patient presents with  ? Leg Pain  ? ? ?Peter Meyer is a 61 y.o. male.  With past medical history of hypertension who presents to the emergency department for right leg pain. ? ?States that right leg pain is mostly in the upper calf.  He states that it started this morning and has been present all day.  Describing the pain leg pain other than that it is present.  No falls or trauma to the leg.  He denies any swelling, fevers.  He has been ambulatory.  This is his ninth visit to the emergency department this month was seen 2 days ago for his hypertension ? ?Leg Pain ?Associated symptoms: no fever   ? ?  ? ?Home Medications ?Prior to Admission medications   ?Medication Sig Start Date End Date Taking? Authorizing Provider  ?amLODipine (NORVASC) 10 MG tablet Take 1 tablet (10 mg total) by mouth daily. 08/07/21 09/06/21  Derwood Kaplan, MD  ?tamsulosin (FLOMAX) 0.4 MG CAPS capsule Take 1 capsule (0.4 mg total) by mouth daily. 07/28/21 08/27/21  Linwood Dibbles, MD  ?   ? ?Allergies    ?Patient has no known allergies.   ? ?Review of Systems   ?Review of Systems  ?Constitutional:  Negative for fever.  ?Musculoskeletal:  Positive for myalgias. Negative for gait problem and joint swelling.  ?All other systems reviewed and are negative. ? ?Physical Exam ?Updated Vital Signs ?BP (!) 208/103 (BP Location: Right Arm)   Pulse 79   Temp 98.1 ?F (36.7 ?C) (Oral)   Resp 17   Ht 6' (1.829 m)   Wt 88 kg   SpO2 100%   BMI 26.31 kg/m?  ?Physical Exam ?Vitals and nursing note reviewed.  ?Constitutional:   ?   General: He is not in acute distress. ?   Appearance: Normal appearance. He is not ill-appearing or toxic-appearing.  ?HENT:  ?   Head: Normocephalic and atraumatic.  ?Eyes:  ?   General: No scleral icterus. ?   Extraocular Movements: Extraocular movements intact.  ?Cardiovascular:  ?    Pulses: Normal pulses.  ?Pulmonary:  ?   Effort: Pulmonary effort is normal. No respiratory distress.  ?Musculoskeletal:     ?   General: Tenderness present. No swelling, deformity or signs of injury. Normal range of motion.  ?   Cervical back: Normal range of motion and neck supple.  ?   Right knee: Normal.  ?   Left knee: Normal.  ?   Right lower leg: No swelling. No edema.  ?   Left lower leg: Tenderness present. No swelling or bony tenderness. No edema.  ?     Legs: ? ?   Comments: Tenderness to the posterior lower right leg ?Knee without effusion, deformity, ecchymoses or erythema ?Compartments soft ?Palpable cord ?Pedal pulses 2+ and no pitting edema  ?Skin: ?   General: Skin is warm and dry.  ?   Capillary Refill: Capillary refill takes less than 2 seconds.  ?   Findings: No rash.  ?Neurological:  ?   General: No focal deficit present.  ?   Mental Status: He is alert and oriented to person, place, and time.  ?Psychiatric:     ?   Mood and Affect: Mood normal.     ?   Behavior: Behavior normal.     ?  Thought Content: Thought content normal.     ?   Judgment: Judgment normal.  ? ? ?ED Results / Procedures / Treatments   ?Labs ?(all labs ordered are listed, but only abnormal results are displayed) ?Labs Reviewed - No data to display ? ?EKG ?None ? ?Radiology ?DG Knee Complete 4 Views Right ? ?Result Date: 08/15/2021 ?CLINICAL DATA:  Right knee pain, no known injury, initial encounter EXAM: RIGHT KNEE - COMPLETE 4+ VIEW COMPARISON:  None. FINDINGS: No evidence of fracture, dislocation, or joint effusion. No evidence of arthropathy or other focal bone abnormality. Soft tissues are unremarkable. IMPRESSION: No acute abnormality noted. Electronically Signed   By: Alcide CleverMark  Lukens M.D.   On: 08/15/2021 22:10   ? ?Procedures ?Procedures  ? ? ?Medications Ordered in ED ?Medications  ?ibuprofen (ADVIL) tablet 600 mg (600 mg Oral Given 08/15/21 2216)  ? ? ?ED Course/ Medical Decision Making/ A&P ?  ?                         ?Medical Decision Making ?Amount and/or Complexity of Data Reviewed ?Radiology: ordered. ? ?Risk ?Prescription drug management. ? ?This patient presents to the ED for concern of leg pain, this involves an extensive number of treatment options, and is a complaint that carries with it a high risk of complications and morbidity.   ? ?Co morbidities that complicate the patient evaluation ?Alcohol use disorder, unhoused ? ?Additional history obtained:  ?Additional history obtained from: None ?External records from outside source obtained and reviewed including: Previous ED visits ? ?Imaging Studies ordered:  ?I ordered imaging studies which included x-ray.  I independently reviewed & interpreted imaging & am in agreement with radiology impression. ?Imaging shows: ?X-ray of the right knee negative ? ?Medications  ?I ordered medication including ibuprofen for pain ?Reevaluation of the patient after medication shows that patient stayed the same ? ?Tests Considered: ?DVT study ? ?ED Course: ?61 year old male who presents to the emergency department with right leg pain. ? ?Physical exam is unremarkable.  He is ambulatory without assistance and there are no focal neurological deficits.  Neurovascularly intact.  There is no palpable cord to the posterior aspect of the right lower extremity.  Imaging is negative.  There is no evidence of fracture, dislocation or effusion.  There is no redness, erythema, fever, exquisite tenderness to the knee concerning for septic joint.  I did order DVT ultrasound study.  Unfortunately, ultrasound is not available at this time so I have placed an order in the disposition for him to have this outpatient tomorrow morning.  ? ?He is otherwise safe for discharge at this time.  I have reviewed the ED visit from 2 days ago.  At that time they consulted transition of care to help with PCP follow-up. ?After consideration of the diagnostic results and the patients response to treatment, I feel that the  patent would benefit from discharge. ?The patient has been appropriately medically screened and/or stabilized in the ED. I have low suspicion for any other emergent medical condition which would require further screening, evaluation or treatment in the ED or require inpatient management. The patient is overall well appearing and non-toxic in appearance. They are hemodynamically stable at time of discharge.   ?Final Clinical Impression(s) / ED Diagnoses ?Final diagnoses:  ?Right leg pain  ? ? ?Rx / DC Orders ?ED Discharge Orders   ? ?      Ordered  ?  US Venous Img Lower Unilateral  Right       ? 08/15/21 2222  ? ?  ?  ? ?  ? ? ?  ?Cristopher Peru, PA-C ?08/16/21 1400 ? ?  ?Glendora Score, MD ?08/17/21 0015 ? ?

## 2021-08-15 NOTE — ED Triage Notes (Signed)
Pt arrived via POV c/o right posterior lower leg pain. Pt reports he was sitting down when pain began. Pt denies injury. Pt ambulatory in Triage.  ?

## 2021-08-16 ENCOUNTER — Other Ambulatory Visit: Payer: Self-pay

## 2021-08-16 ENCOUNTER — Emergency Department (HOSPITAL_COMMUNITY)
Admission: EM | Admit: 2021-08-16 | Discharge: 2021-08-17 | Disposition: A | Discharge status: Home / Self Care | Payer: Self-pay | Attending: Emergency Medicine | Admitting: Emergency Medicine

## 2021-08-16 ENCOUNTER — Encounter (HOSPITAL_COMMUNITY): Payer: Self-pay

## 2021-08-16 DIAGNOSIS — W108XXA Fall (on) (from) other stairs and steps, initial encounter: Secondary | ICD-10-CM

## 2021-08-16 DIAGNOSIS — M79604 Pain in right leg: Secondary | ICD-10-CM | POA: Insufficient documentation

## 2021-08-16 DIAGNOSIS — Z79899 Other long term (current) drug therapy: Secondary | ICD-10-CM | POA: Insufficient documentation

## 2021-08-16 DIAGNOSIS — I10 Essential (primary) hypertension: Secondary | ICD-10-CM | POA: Insufficient documentation

## 2021-08-16 DIAGNOSIS — W109XXA Fall (on) (from) unspecified stairs and steps, initial encounter: Secondary | ICD-10-CM | POA: Insufficient documentation

## 2021-08-16 NOTE — ED Triage Notes (Signed)
Pt ambulatory to triage.  Reports fall and hit head.  Reports head pain 8/10.  Denies loc.  Resp even and unlabored.  Skin warm and dry.  nad ?

## 2021-08-17 ENCOUNTER — Emergency Department (HOSPITAL_COMMUNITY): Payer: Self-pay

## 2021-08-17 ENCOUNTER — Encounter (HOSPITAL_COMMUNITY): Payer: Self-pay | Admitting: Emergency Medicine

## 2021-08-17 ENCOUNTER — Other Ambulatory Visit: Payer: Self-pay

## 2021-08-17 ENCOUNTER — Emergency Department (HOSPITAL_COMMUNITY)
Admission: EM | Admit: 2021-08-17 | Discharge: 2021-08-17 | Disposition: A | Discharge status: Home / Self Care | Payer: Self-pay | Attending: Emergency Medicine | Admitting: Emergency Medicine

## 2021-08-17 DIAGNOSIS — B349 Viral infection, unspecified: Secondary | ICD-10-CM

## 2021-08-17 DIAGNOSIS — M791 Myalgia, unspecified site: Secondary | ICD-10-CM | POA: Insufficient documentation

## 2021-08-17 MED ORDER — LISINOPRIL 20 MG PO TABS: 20.0000 mg | ORAL_TABLET | Freq: Every day | ORAL | 0 refills | Status: DC

## 2021-08-17 NOTE — Discharge Instructions (Addendum)
Your blood pressure was very high tonight, and has been very high at every ED visit you have had on record.  I am concerned that your blood pressure is not being adequately controlled.  You are being given a new prescription for some blood pressure medication.  You need to take both the amlodipine, which you have been taking, and the lisinopril which was prescribed tonight.  You should check your blood pressure once a day and keep a record of it.  You need to see your primary care provider in 2-3 weeks to see if your blood pressure is responding appropriately to the medication.  You may need additional medication to control your blood pressure.  Blood pressure that is not adequately controlled can lead to heart attacks, strokes, kidney failure. ?

## 2021-08-17 NOTE — ED Notes (Signed)
Pt to hall ambulating towards lobby. Pt stopped and went over d/c papers. Pt did not want to stop for repeat v/s or to sign.  ?

## 2021-08-17 NOTE — ED Triage Notes (Signed)
Pt reports generalized body aches since this am; denies other symptoms  ?

## 2021-08-17 NOTE — ED Provider Notes (Signed)
?Peter Meyer ?Provider Note ? ? ?CSN: [id number] ?Arrival date & time: 08/17/21  2050 ? ?  ? ?History ? ?Chief Complaint  ?Patient presents with  ? Generalized Body Aches  ? ? ?[name] is a 61 y.o. male. ? ?Patient is a 61 year old male well-known to the emergency department presenting with generalized body aches and feeling generally unwell.  This apparently started this morning.  Patient with frequent visits seeming to involve food and shelter.  Patient denies to me he is experiencing any fever.  History somewhat limited as patient is sleeping and difficult to wake up. ? ?The history is provided by the patient.  ? ?  ? ?Home Medications ?Prior to Admission medications   ?Medication Sig Start Date End Date Taking? Authorizing Provider  ?amLODipine (NORVASC) 10 MG tablet Take 1 tablet (10 mg total) by mouth daily. 08/07/21 09/06/21  Varney Biles, MD  ?lisinopril (ZESTRIL) 20 MG tablet Take 1 tablet (20 mg total) by mouth daily. [id number]   Delora Fuel, MD  ?tamsulosin (FLOMAX) 0.4 MG CAPS capsule Take 1 capsule (0.4 mg total) by mouth daily. 07/28/21 08/27/21  Dorie Rank, MD  ?   ? ?Allergies    ?Patient has no known allergies.   ? ?Review of Systems   ?Review of Systems  ?All other systems reviewed and are negative. ? ?Physical Exam ?Updated Vital Signs ?BP (!) 225/117 (BP Location: Right Arm)   Pulse 80   Temp 98.2 ?F (36.8 ?C) (Oral)   Resp 17   Ht 6' (1.829 m)   Wt 87.1 kg   SpO2 100%   BMI 26.04 kg/m?  ?Physical Exam ?Vitals and nursing note reviewed.  ?Constitutional:   ?   General: He is not in acute distress. ?   Appearance: He is well-developed. He is not diaphoretic.  ?HENT:  ?   Head: Normocephalic and atraumatic.  ?   Mouth/Throat:  ?   Mouth: Mucous membranes are moist.  ?   Pharynx: No oropharyngeal exudate or posterior oropharyngeal erythema.  ?Cardiovascular:  ?   Rate and Rhythm: Normal rate and regular rhythm.  ?   Heart sounds: No murmur heard. ?  No friction rub.   ?Pulmonary:  ?   Effort: Pulmonary effort is normal. No respiratory distress.  ?   Breath sounds: Normal breath sounds. No wheezing or rales.  ?Abdominal:  ?   General: Bowel sounds are normal. There is no distension.  ?   Palpations: Abdomen is soft.  ?   Tenderness: There is no abdominal tenderness.  ?Musculoskeletal:     ?   General: Normal range of motion.  ?   Cervical back: Normal range of motion and neck supple.  ?Skin: ?   General: Skin is warm and dry.  ?Neurological:  ?   Mental Status: He is alert and oriented to person, place, and time.  ?   Coordination: Coordination normal.  ? ? ?ED Results / Procedures / Treatments   ?Labs ?(all labs ordered are listed, but only abnormal results are displayed) ?Labs Reviewed - No data to display ? ?EKG ?None ? ?Radiology ?CT Head Wo Contrast ? ?Result Date: 08/17/2021 ?CLINICAL DATA:  Status post fall. EXAM: CT HEAD WITHOUT CONTRAST TECHNIQUE: Contiguous axial images were obtained from the base of the skull through the vertex without intravenous contrast. RADIATION DOSE REDUCTION: This exam was performed according to the departmental dose-optimization program which includes automated exposure control, adjustment of the mA and/or kV according to  patient size and/or use of iterative reconstruction technique. COMPARISON:  August 11, 2021 FINDINGS: Brain: There is mild cerebral atrophy with widening of the extra-axial spaces and ventricular dilatation. There are areas of decreased attenuation within the white matter tracts of the supratentorial brain, consistent with microvascular disease changes. Vascular: No hyperdense vessel or unexpected calcification. Skull: Normal. Negative for fracture or focal lesion. Sinuses/Orbits: Chronic deformities of the medial walls of bilateral orbits are seen. Other: None. IMPRESSION: 1. Generalized cerebral atrophy. 2. No acute intracranial abnormality. Electronically Signed   By: Virgina Norfolk M.D.   On: 08/17/2021 01:55  ? ?CT  Cervical Spine Wo Contrast ? ?Result Date: 08/17/2021 ?CLINICAL DATA:  Status post fall. EXAM: CT CERVICAL SPINE WITHOUT CONTRAST TECHNIQUE: Multidetector CT imaging of the cervical spine was performed without intravenous contrast. Multiplanar CT image reconstructions were also generated. RADIATION DOSE REDUCTION: This exam was performed according to the departmental dose-optimization program which includes automated exposure control, adjustment of the mA and/or kV according to patient size and/or use of iterative reconstruction technique. COMPARISON:  August 11, 2021 FINDINGS: Alignment: There is straightening of the normal cervical spine lordosis. Skull base and vertebrae: No acute fracture. No primary bone lesion or focal pathologic process. Soft tissues and spinal canal: No prevertebral fluid or swelling. No visible canal hematoma. Disc levels: Moderate to marked severity anterior osteophyte formation is seen at the levels of C4-C5, C5-C6 and C6-C7. Mild narrowing of the anterior atlantoaxial articulation is seen with preservation of intervertebral disc space height noted throughout the remainder of the cervical spine. Bilateral moderate severity multilevel facet joint hypertrophy is noted. Upper chest: Negative. Other: Small metallic density shrapnel fragments are again seen within the para mandibular soft tissues on the left. IMPRESSION: 1. No acute fracture or subluxation of the cervical spine. 2. Moderate to marked severity degenerative changes at the levels of C4-C5, C5-C6 and C6-C7. Electronically Signed   By: Virgina Norfolk M.D.   On: 08/17/2021 01:59   ? ?Procedures ?Procedures  ? ? ?Medications Ordered in ED ?Medications - No data to display ? ?ED Course/ Medical Decision Making/ A&P ? ?Patient presenting with complaints of generalized body aches, likely viral in nature.  Patient's vital signs are stable and physical examination is unremarkable.  Patient is well-known to the emergency department and  frequents the ER likely related to homelessness.  I see no indication for any work-up and feel as though patient can safely be discharged.  He is to alternate Tylenol and Motrin and drink plenty of fluids.  To return as needed for any problems. ? ?Final Clinical Impression(s) / ED Diagnoses ?Final diagnoses:  ?None  ? ? ?Rx / DC Orders ?ED Discharge Orders   ? ? None  ? ?  ? ? ?  ?Veryl Speak, MD ?08/17/21 2311 ? ?

## 2021-08-17 NOTE — ED Provider Notes (Signed)
?[name] ?Provider Note ? ? ?CSN: [id number] ?Arrival date & time: 08/16/21  2135 ? ?  ? ?History ? ?Chief Complaint  ?Patient presents with  ? Fall  ? ? ?Peter Meyer is a 61 y.o. male. ? ?The history is provided by the patient.  ?Fall ?He has history of hypertension and comes in having fallen down about 5 concrete steps.  He denies injury, states he just wants to be checked.  He has ongoing pain in his right leg, was seen in the emergency department for that yesterday.  He denies loss of consciousness, weakness, nausea, vomiting.  He is not on any anticoagulants or antiplatelet agents. ?  ?Home Medications ?Prior to Admission medications   ?Medication Sig Start Date End Date Taking? Authorizing Provider  ?amLODipine (NORVASC) 10 MG tablet Take 1 tablet (10 mg total) by mouth daily. 08/07/21 09/06/21  Derwood Kaplan, MD  ?tamsulosin (FLOMAX) 0.4 MG CAPS capsule Take 1 capsule (0.4 mg total) by mouth daily. 07/28/21 08/27/21  Linwood Dibbles, MD  ?   ? ?Allergies    ?Patient has no known allergies.   ? ?Review of Systems   ?Review of Systems  ?All other systems reviewed and are negative. ? ?Physical Exam ?Updated Vital Signs ?BP (!) 223/111   Pulse 78   Temp 98.2 ?F (36.8 ?C) (Oral)   Resp 16   Ht 6' (1.829 m)   Wt 87.1 kg   SpO2 100%   BMI 26.04 kg/m?  ?Physical Exam ?Vitals and nursing note reviewed.  ?61 year old male, resting comfortably and in no acute distress. Vital signs are significant for markedly elevated blood pressure. Oxygen saturation is 100%, which is normal. ?Head is normocephalic and atraumatic. PERRLA, EOMI. Oropharynx is clear. ?Neck is nontender without adenopathy or JVD. ?Back is nontender and there is no CVA tenderness. ?Lungs are clear without rales, wheezes, or rhonchi. ?Chest is nontender. ?Heart has regular rate and rhythm without murmur. ?Abdomen is soft, flat, nontender without masses or hepatosplenomegaly and peristalsis is normoactive. ?Extremities have no  cyanosis or edema, full range of motion is present.  There is some pain on passive range of motion of the right leg, but this is unchanged from what it had been previous to his fall. ?Skin is warm and dry without rash. ?Neurologic: Mental status is normal, cranial nerves are intact, moves all extremities equally. ? ?ED Results / Procedures / Treatments   ? ?Radiology ?CT Head Wo Contrast ? ?Result Date: 08/17/2021 ?CLINICAL DATA:  Status post fall. EXAM: CT HEAD WITHOUT CONTRAST TECHNIQUE: Contiguous axial images were obtained from the base of the skull through the vertex without intravenous contrast. RADIATION DOSE REDUCTION: This exam was performed according to the departmental dose-optimization program which includes automated exposure control, adjustment of the mA and/or kV according to patient size and/or use of iterative reconstruction technique. COMPARISON:  August 11, 2021 FINDINGS: Brain: There is mild cerebral atrophy with widening of the extra-axial spaces and ventricular dilatation. There are areas of decreased attenuation within the white matter tracts of the supratentorial brain, consistent with microvascular disease changes. Vascular: No hyperdense vessel or unexpected calcification. Skull: Normal. Negative for fracture or focal lesion. Sinuses/Orbits: Chronic deformities of the medial walls of bilateral orbits are seen. Other: None. IMPRESSION: 1. Generalized cerebral atrophy. 2. No acute intracranial abnormality. Electronically Signed   By: Aram Candela M.D.   On: 08/17/2021 01:55  ? ?CT Cervical Spine Wo Contrast ? ?Result Date: 08/17/2021 ?CLINICAL DATA:  Status post fall. EXAM: CT CERVICAL SPINE WITHOUT CONTRAST TECHNIQUE: Multidetector CT imaging of the cervical spine was performed without intravenous contrast. Multiplanar CT image reconstructions were also generated. RADIATION DOSE REDUCTION: This exam was performed according to the departmental dose-optimization program which includes  automated exposure control, adjustment of the mA and/or kV according to patient size and/or use of iterative reconstruction technique. COMPARISON:  August 11, 2021 FINDINGS: Alignment: There is straightening of the normal cervical spine lordosis. Skull base and vertebrae: No acute fracture. No primary bone lesion or focal pathologic process. Soft tissues and spinal canal: No prevertebral fluid or swelling. No visible canal hematoma. Disc levels: Moderate to marked severity anterior osteophyte formation is seen at the levels of C4-C5, C5-C6 and C6-C7. Mild narrowing of the anterior atlantoaxial articulation is seen with preservation of intervertebral disc space height noted throughout the remainder of the cervical spine. Bilateral moderate severity multilevel facet joint hypertrophy is noted. Upper chest: Negative. Other: Small metallic density shrapnel fragments are again seen within the para mandibular soft tissues on the left. IMPRESSION: 1. No acute fracture or subluxation of the cervical spine. 2. Moderate to marked severity degenerative changes at the levels of C4-C5, C5-C6 and C6-C7. Electronically Signed   By: Aram Candela M.D.   On: 08/17/2021 01:59  ? ?DG Knee Complete 4 Views Right ? ?Result Date: 08/15/2021 ?CLINICAL DATA:  Right knee pain, no known injury, initial encounter EXAM: RIGHT KNEE - COMPLETE 4+ VIEW COMPARISON:  None. FINDINGS: No evidence of fracture, dislocation, or joint effusion. No evidence of arthropathy or other focal bone abnormality. Soft tissues are unremarkable. IMPRESSION: No acute abnormality noted. Electronically Signed   By: Alcide Clever M.D.   On: 08/15/2021 22:10   ? ?Procedures ?Procedures  ? ? ?Medications Ordered in ED ?Medications - No data to display ? ?ED Course/ Medical Decision Making/ A&P ?  ?                        ?Medical Decision Making ?Amount and/or Complexity of Data Reviewed ?Radiology: ordered. ? ? ?Fall down steps without apparent injury.  However, based  on mechanism of injury, will send for CT of head and cervical spine.  Markedly elevated blood pressure.  Old records are reviewed, and patient is noted to have extremely elevated blood pressure with every ED visit going back to 2013.  He is only on a modest dose of amlodipine for his blood pressure, will clearly need additional antihypertensive treatment. ? ?CT scans show no acute injury.  I have independently viewed the images, and agree with the radiologist's interpretation.  Patient is reassured about the CT findings, given prescription for lisinopril to add to his antihypertensive regimen.  He will need to follow-up with his PCP in 2-3 weeks to assess response. ? ?Final Clinical Impression(s) / ED Diagnoses ?Final diagnoses:  ?Fall down steps, initial encounter  ?Elevated blood pressure reading with diagnosis of hypertension  ? ? ?Rx / DC Orders ?ED Discharge Orders   ? ?      Ordered  ?  lisinopril (ZESTRIL) 20 MG tablet  Daily       ? 08/17/21 0211  ? ?  ?  ? ?  ? ? ?  ?Dione Booze, MD ?08/17/21 [phone] ? ?

## 2021-08-17 NOTE — Discharge Instructions (Signed)
Drink plenty of fluids and get plenty of rest. ? ?Take Tylenol 1000 mg rotated with ibuprofen 600 mg every 4 hours as needed for pain or fever. ?

## 2021-08-18 ENCOUNTER — Encounter (HOSPITAL_COMMUNITY): Payer: Self-pay | Admitting: *Deleted

## 2021-08-18 ENCOUNTER — Emergency Department (HOSPITAL_COMMUNITY)
Admission: EM | Admit: 2021-08-18 | Discharge: 2021-08-19 | Disposition: A | Discharge status: Home / Self Care | Payer: Medicaid Other | Attending: Emergency Medicine | Admitting: Emergency Medicine

## 2021-08-18 DIAGNOSIS — F17210 Nicotine dependence, cigarettes, uncomplicated: Secondary | ICD-10-CM | POA: Insufficient documentation

## 2021-08-18 DIAGNOSIS — Z5900 Homelessness unspecified: Secondary | ICD-10-CM | POA: Insufficient documentation

## 2021-08-18 DIAGNOSIS — I10 Essential (primary) hypertension: Secondary | ICD-10-CM | POA: Diagnosis not present

## 2021-08-18 DIAGNOSIS — G8929 Other chronic pain: Secondary | ICD-10-CM

## 2021-08-18 DIAGNOSIS — R519 Headache, unspecified: Secondary | ICD-10-CM | POA: Diagnosis present

## 2021-08-18 DIAGNOSIS — R5383 Other fatigue: Secondary | ICD-10-CM | POA: Diagnosis not present

## 2021-08-18 NOTE — ED Triage Notes (Signed)
Pt c/o pain all over and HA.  Pt seen here for same yesterday.  ?

## 2021-08-18 NOTE — ED Notes (Signed)
Pt states he is homeless.

## 2021-08-19 ENCOUNTER — Other Ambulatory Visit: Payer: Self-pay

## 2021-08-19 ENCOUNTER — Encounter (HOSPITAL_COMMUNITY): Payer: Self-pay | Admitting: Emergency Medicine

## 2021-08-19 ENCOUNTER — Emergency Department (HOSPITAL_COMMUNITY)
Admission: EM | Admit: 2021-08-19 | Discharge: 2021-08-19 | Disposition: A | Discharge status: Home / Self Care | Payer: Medicaid Other | Attending: Emergency Medicine | Admitting: Emergency Medicine

## 2021-08-19 DIAGNOSIS — R519 Headache, unspecified: Secondary | ICD-10-CM | POA: Diagnosis present

## 2021-08-19 DIAGNOSIS — I10 Essential (primary) hypertension: Secondary | ICD-10-CM | POA: Insufficient documentation

## 2021-08-19 MED ORDER — LISINOPRIL 10 MG PO TABS
20.0000 mg | ORAL_TABLET | ORAL | Status: AC
  Administered 2021-08-19: 20 mg via ORAL
  Filled 2021-08-19: qty 2

## 2021-08-19 MED ORDER — AMLODIPINE BESYLATE 5 MG PO TABS
10.0000 mg | ORAL_TABLET | Freq: Once | ORAL | Status: AC
  Administered 2021-08-19: 10 mg via ORAL
  Filled 2021-08-19: qty 2

## 2021-08-19 NOTE — ED Provider Notes (Signed)
?[facility] ?Provider Note ? ? ?CSN: [id number] ?Arrival date & time: 08/19/21  2127 ? ?  ? ?History ? ?Chief Complaint  ?Patient presents with  ? Headache  ? ? ?Peter Meyer is a 61 y.o. male. ? ?Presents with multiple complaints.  Patient homeless, has been seen countless times in the emergency department.  He initially checked in with complaints about headache.  This has been his normal complaint over recent weeks.  He had CT head performed yesterday.  Patient is not compliant with his medications. ? ? ?  ? ?Home Medications ?Prior to Admission medications   ?Medication Sig Start Date End Date Taking? Authorizing Provider  ?amLODipine (NORVASC) 10 MG tablet Take 1 tablet (10 mg total) by mouth daily. 08/07/21 09/06/21  Varney Biles, MD  ?lisinopril (ZESTRIL) 20 MG tablet Take 1 tablet (20 mg total) by mouth daily. [id number]   Delora Fuel, MD  ?tamsulosin (FLOMAX) 0.4 MG CAPS capsule Take 1 capsule (0.4 mg total) by mouth daily. 07/28/21 08/27/21  Dorie Rank, MD  ?   ? ?Allergies    ?Patient has no known allergies.   ? ?Review of Systems   ?Review of Systems  ?Gastrointestinal:  Positive for abdominal pain.  ?Neurological:  Positive for headaches.  ? ?Physical Exam ?Updated Vital Signs ?BP (!) 215/110 (BP Location: Right Arm)   Pulse 74   Temp 97.9 ?F (36.6 ?C) (Oral)   Resp 19   Ht 6' (1.829 m)   Wt 82.6 kg   SpO2 99%   BMI 24.68 kg/m?  ?Physical Exam ?Vitals and nursing note reviewed.  ?Constitutional:   ?   General: He is not in acute distress. ?   Appearance: He is well-developed.  ?HENT:  ?   Head: Normocephalic and atraumatic.  ?   Mouth/Throat:  ?   Mouth: Mucous membranes are moist.  ?Eyes:  ?   General: Vision grossly intact. Gaze aligned appropriately.  ?   Extraocular Movements: Extraocular movements intact.  ?   Conjunctiva/sclera: Conjunctivae normal.  ?Cardiovascular:  ?   Rate and Rhythm: Normal rate and regular rhythm.  ?   Pulses: Normal pulses.  ?   Heart sounds:  Normal heart sounds, S1 normal and S2 normal. No murmur heard. ?  No friction rub. No gallop.  ?Pulmonary:  ?   Effort: Pulmonary effort is normal. No respiratory distress.  ?   Breath sounds: Normal breath sounds.  ?Abdominal:  ?   Palpations: Abdomen is soft.  ?   Tenderness: There is no abdominal tenderness. There is no guarding or rebound.  ?   Hernia: No hernia is present.  ?Musculoskeletal:     ?   General: No swelling.  ?   Cervical back: Full passive range of motion without pain, normal range of motion and neck supple. No pain with movement, spinous process tenderness or muscular tenderness. Normal range of motion.  ?   Right lower leg: No edema.  ?   Left lower leg: No edema.  ?Skin: ?   General: Skin is warm and dry.  ?   Capillary Refill: Capillary refill takes less than 2 seconds.  ?   Findings: No ecchymosis, erythema, lesion or wound.  ?Neurological:  ?   Mental Status: He is alert and oriented to person, place, and time.  ?   GCS: GCS eye subscore is 4. GCS verbal subscore is 5. GCS motor subscore is 6.  ?   Cranial Nerves: Cranial nerves 2-12  are intact.  ?   Sensory: Sensation is intact.  ?   Motor: Motor function is intact. No weakness or abnormal muscle tone.  ?   Coordination: Coordination is intact.  ?Psychiatric:     ?   Mood and Affect: Mood normal.     ?   Speech: Speech normal.     ?   Behavior: Behavior normal.  ? ? ?ED Results / Procedures / Treatments   ?Labs ?(all labs ordered are listed, but only abnormal results are displayed) ?Labs Reviewed - No data to display ? ?EKG ?None ? ?Radiology ?No results found. ? ?Procedures ?Procedures  ? ? ?Medications Ordered in ED ?Medications  ?amLODipine (NORVASC) tablet 10 mg (has no administration in time range)  ?lisinopril (ZESTRIL) tablet 20 mg (has no administration in time range)  ? ? ?ED Course/ Medical Decision Making/ A&P ?  ?                        ?Medical Decision Making ? ?Patient sleeping upon my entering the exam room.  He easily  awakens.  Patient initially complained of headache.  When I entered the room he reported that his stomach hurt because he was hungry.  I did review his records.  He has a history of countless visits recently for abdominal pain as well as headache.  This has been thoroughly worked up in the past.  He is hypertensive.  This is secondary to chronic essential hypertension and noncompliance with his medications.  At previous visits, social work is even tried to work with him to get his medications but he is chronically noncompliant.  We will give him a dose of his Norvasc, discharge.  No work-up necessary tonight.  Neurologic exam is Entirely normal. ? ? ? ? ? ? ?Final Clinical Impression(s) / ED Diagnoses ?Final diagnoses:  ?Primary hypertension  ? ? ?Rx / DC Orders ?ED Discharge Orders   ? ? None  ? ?  ? ? ?  ?Orpah Greek, MD ?08/19/21 2313 ? ?

## 2021-08-19 NOTE — ED Triage Notes (Signed)
Pt c/o generalized body pain, abd pain and headache, pt seen for same the past 2 days  ?

## 2021-08-19 NOTE — ED Provider Notes (Signed)
?[facility]Y DEPT ?[facility] Emergency Department ?Provider Note ?MRN:  [id number]  ?Arrival date & time: 08/19/21    ? ?Chief Complaint   ?Headache ?  ?History of Present Illness   ?Peter Meyer is a 61 y.o. year-old male with a history of hypertension presenting to the ED with chief complaint of headache. ? ?Persistent headaches for weeks to months.  No significant changes recently.  Also feeling very tired, has not had much sleep lately due to homelessness.  No numbness or weakness to the arms or legs. ? ?Review of Systems  ?A thorough review of systems was obtained and all systems are negative except as noted in the HPI and PMH.  ? ?Patient's Health History   ? ?Past Medical History:  ?Diagnosis Date  ? Hypertension   ?  ?History reviewed. No pertinent surgical history.  ?History reviewed. No pertinent family history.  ?Social History  ? ?Socioeconomic History  ? Marital status: Legally Separated  ?  Spouse name: Not on file  ? Number of children: Not on file  ? Years of education: Not on file  ? Highest education level: Not on file  ?Occupational History  ? Not on file  ?Tobacco Use  ? Smoking status: Every Day  ?  Types: Cigarettes  ? Smokeless tobacco: Never  ? Tobacco comments:  ?  3-4 cigarettes daily   ?Vaping Use  ? Vaping Use: Never used  ?Substance and Sexual Activity  ? Alcohol use: Yes  ?  Alcohol/week: 2.0 standard drinks  ?  Types: 2 Cans of beer per week  ?  Comment: weekends  ? Drug use: No  ? Sexual activity: Not on file  ?Other Topics Concern  ? Not on file  ?Social History Narrative  ? Not on file  ? ?Social Determinants of Health  ? ?Financial Resource Strain: Not on file  ?Food Insecurity: Not on file  ?Transportation Needs: Not on file  ?Physical Activity: Not on file  ?Stress: Not on file  ?Social Connections: Not on file  ?Intimate Partner Violence: Not on file  ?  ? ?Physical Exam  ? ?Vitals:  ? 08/18/21 2305 08/18/21 2330  ?BP: (!) 209/104 (!) 191/92  ?Pulse: 83 82  ?Resp:  20 20  ?Temp:    ?SpO2: 100% 96%  ?  ?CONSTITUTIONAL: Well-appearing, NAD ?NEURO/PSYCH:  Alert and oriented x 3, normal and symmetric strength and sensation, normal coordination, normal speech ?EYES:  eyes equal and reactive ?ENT/NECK:  no LAD, no JVD ?CARDIO: Regular rate, well-perfused, normal S1 and S2 ?PULM:  CTAB no wheezing or rhonchi ?GI/GU:  non-distended, non-tender ?MSK/SPINE:  No gross deformities, no edema ?SKIN:  no rash, atraumatic ? ? ?*Additional and/or pertinent findings included in MDM below ? ?Diagnostic and Interventional Summary  ? ? EKG Interpretation ? ?Date/Time:    ?Ventricular Rate:    ?PR Interval:    ?QRS Duration:   ?QT Interval:    ?QTC Calculation:   ?R Axis:     ?Text Interpretation:   ?  ? ?  ? ?Labs Reviewed - No data to display  ?No orders to display  ?  ?Medications - No data to display  ? ?Procedures  /  Critical Care ?Procedures ? ?ED Course and Medical Decision Making  ?Initial Impression and Ddx ?Chronic headache, however seems patient is mostly here for shelter, wants somewhere to rest.  Significant hypertension noted, has not been taking any home meds.  Has had a recent CT head just a  few days ago that was normal, normal neurological exam here, doubt intracranial bleeding or emergent process.  Appropriate for discharge, will refer to case management/social work to see if we can get him on some antihypertensives. ? ?Past medical/surgical history that increases complexity of ED encounter: Hypertension ? ?Interpretation of Diagnostics ?CT head was considered however recently performed and patient has normal neurological exam, imaging not indicated. ? ?Patient Reassessment and Ultimate Disposition/Management ?Discharge. ? ?Patient management required discussion with the following services or consulting groups:  None ? ?Complexity of Problems Addressed ?Acute illness or injury that poses threat of life of bodily function ? ?Additional Data Reviewed and Analyzed ?Further history  obtained from: ?Prior ED visit notes ? ?Additional Factors Impacting ED Encounter Risk ?SDOH impact on management ? ?[name], MD ?[facility] Emergency Medicine ?[facility] [email or web address] ? ?Final Clinical Impressions(s) / ED Diagnoses  ? ?  ICD-10-CM   ?1. Chronic nonintractable headache, unspecified headache type  R51.9   ? G89.29   ?  ?  ?ED Discharge Orders   ? ? None  ? ?  ?  ? ?Discharge Instructions Discussed with and Provided to Patient:  ? ?Discharge Instructions   ?None ?  ? ?  ?Sabas Sous, MD ?08/19/21 [phone] ? ?

## 2021-08-19 NOTE — ED Notes (Signed)
Pt left treatment area prior to getting discharge papers or vitals.  ?

## 2021-08-21 ENCOUNTER — Other Ambulatory Visit: Payer: Self-pay

## 2021-08-21 ENCOUNTER — Encounter (HOSPITAL_COMMUNITY): Payer: Self-pay

## 2021-08-21 ENCOUNTER — Emergency Department (HOSPITAL_COMMUNITY)
Admission: EM | Admit: 2021-08-21 | Discharge: 2021-08-21 | Disposition: A | Discharge status: Home / Self Care | Payer: Medicaid Other | Attending: Emergency Medicine | Admitting: Emergency Medicine

## 2021-08-21 DIAGNOSIS — M79606 Pain in leg, unspecified: Secondary | ICD-10-CM | POA: Insufficient documentation

## 2021-08-21 DIAGNOSIS — I10 Essential (primary) hypertension: Secondary | ICD-10-CM | POA: Insufficient documentation

## 2021-08-21 DIAGNOSIS — Z79899 Other long term (current) drug therapy: Secondary | ICD-10-CM | POA: Insufficient documentation

## 2021-08-21 MED ORDER — AMLODIPINE BESYLATE 5 MG PO TABS
10.0000 mg | ORAL_TABLET | Freq: Once | ORAL | Status: AC
  Administered 2021-08-21: 10 mg via ORAL
  Filled 2021-08-21: qty 2

## 2021-08-21 MED ORDER — LISINOPRIL 10 MG PO TABS
20.0000 mg | ORAL_TABLET | Freq: Once | ORAL | Status: AC
  Administered 2021-08-21: 20 mg via ORAL
  Filled 2021-08-21: qty 2

## 2021-08-21 MED ORDER — AMLODIPINE BESYLATE 10 MG PO TABS: 10.0000 mg | ORAL_TABLET | Freq: Every day | ORAL | 0 refills | Status: DC

## 2021-08-21 MED ORDER — ACETAMINOPHEN 325 MG PO TABS
650.0000 mg | ORAL_TABLET | Freq: Four times a day (QID) | ORAL | Status: DC | PRN
  Administered 2021-08-21: 650 mg via ORAL
  Filled 2021-08-21: qty 2

## 2021-08-21 NOTE — ED Provider Notes (Signed)
?[facility] EMERGENCY DEPARTMENT ?Provider Note ? ? ?CSN: [id number] ?Arrival date & time: 08/21/21  1859 ? ?  ? ?History ? ?Chief Complaint  ?Patient presents with  ? Generalized Body Pain  ? ? ?Peter Meyer is a 61 y.o. male. ? ?Patient complains of cramping in his legs patient also states he has not taken his blood pressure medicine today.  Patient is requesting something for leg pain and something to eat.  Patient has frequent ED visits and is noncompliant with his blood pressure medicines states . he has been unable to get his medications filled.  Patient reports he has been sent to the clinic but needs to make an appointment for follow-up.  Patient denies any new complaints he does not have a headache he denies any chest pain not having any abdominal pain ? ?The history is provided by the patient. No language interpreter was used.  ? ?  ? ?Home Medications ?Prior to Admission medications   ?Medication Sig Start Date End Date Taking? Authorizing Provider  ?amLODipine (NORVASC) 10 MG tablet Take 1 tablet (10 mg total) by mouth daily. 08/21/21 09/20/21  Elson Areas, PA-C  ?lisinopril (ZESTRIL) 20 MG tablet Take 1 tablet (20 mg total) by mouth daily. 08/17/21   Dione Booze, MD  ?tamsulosin (FLOMAX) 0.4 MG CAPS capsule Take 1 capsule (0.4 mg total) by mouth daily. 07/28/21 08/27/21  Linwood Dibbles, MD  ?   ? ?Allergies    ?Patient has no known allergies.   ? ?Review of Systems   ?Review of Systems  ?All other systems reviewed and are negative. ? ?Physical Exam ?Updated Vital Signs ?BP (!) 193/102   Pulse 83   Temp 98.7 ?F (37.1 ?C) (Oral)   Resp 16   Ht 6' (1.829 m)   Wt 82.6 kg   SpO2 99%   BMI 24.68 kg/m?  ?Physical Exam ?Vitals and nursing note reviewed.  ?Constitutional:   ?   Appearance: He is well-developed.  ?HENT:  ?   Head: Normocephalic.  ?   Right Ear: External ear normal.  ?   Left Ear: External ear normal.  ?   Mouth/Throat:  ?   Mouth: Mucous membranes are moist.  ?Cardiovascular:  ?   Rate and  Rhythm: Normal rate.  ?Pulmonary:  ?   Effort: Pulmonary effort is normal.  ?Abdominal:  ?   General: There is no distension.  ?Musculoskeletal:     ?   General: No swelling or tenderness. Normal range of motion.  ?   Cervical back: Normal range of motion.  ?Neurological:  ?   Mental Status: He is alert and oriented to person, place, and time.  ? ? ?ED Results / Procedures / Treatments   ?Labs ?(all labs ordered are listed, but only abnormal results are displayed) ?Labs Reviewed - No data to display ? ?EKG ?None ? ?Radiology ?No results found. ? ?Procedures ?Procedures  ? ? ?Medications Ordered in ED ?Medications  ?acetaminophen (TYLENOL) tablet 650 mg (650 mg Oral Given 08/21/21 2220)  ?amLODipine (NORVASC) tablet 10 mg (10 mg Oral Given 08/21/21 2220)  ?lisinopril (ZESTRIL) tablet 20 mg (20 mg Oral Given 08/21/21 2220)  ? ? ?ED Course/ Medical Decision Making/ A&P ?  ?                        ?Medical Decision Making ?Risk ?OTC drugs. ?Prescription drug management. ? ? ?MDM: Patient is homeless.  He is seen in the  emergency department frequently patient is given his blood pressure medicines here he is given Tylenol for his leg cramps patient is given a meal while he is here.  Patient is discharged in stable condition to follow-up at the free clinic. ? ? ? ? ? ? ? ?Final Clinical Impression(s) / ED Diagnoses ?Final diagnoses:  ?Hypertension, unspecified type  ? ? ?Rx / DC Orders ?ED Discharge Orders   ? ?      Ordered  ?  amLODipine (NORVASC) 10 MG tablet  Daily       ? 08/21/21 2300  ? ?  ?  ? ?  ? ?An After Visit Summary was printed and given to the patient.  ?  ?Elson Areas, PA-C ?08/21/21 2352 ? ?  ?Maia Plan, MD ?08/22/21 1212 ? ?

## 2021-08-21 NOTE — ED Notes (Signed)
Meal and Ginger Ale given  ?

## 2021-08-21 NOTE — Discharge Instructions (Signed)
Take your medications

## 2021-08-21 NOTE — ED Triage Notes (Signed)
Pt c/o leg, neck, eye, back pain that started before he came here.  ? ?Pt hypertensive at 193/102 ?

## 2021-08-22 ENCOUNTER — Encounter (HOSPITAL_COMMUNITY): Payer: Self-pay | Admitting: Emergency Medicine

## 2021-08-22 ENCOUNTER — Other Ambulatory Visit: Payer: Self-pay

## 2021-08-22 ENCOUNTER — Emergency Department (HOSPITAL_COMMUNITY)
Admission: EM | Admit: 2021-08-22 | Discharge: 2021-08-22 | Discharge status: Against Medical Advice | Payer: Medicaid Other | Attending: Emergency Medicine | Admitting: Emergency Medicine

## 2021-08-22 DIAGNOSIS — Z760 Encounter for issue of repeat prescription: Secondary | ICD-10-CM | POA: Diagnosis present

## 2021-08-22 DIAGNOSIS — I10 Essential (primary) hypertension: Secondary | ICD-10-CM | POA: Diagnosis not present

## 2021-08-22 DIAGNOSIS — Z79899 Other long term (current) drug therapy: Secondary | ICD-10-CM | POA: Diagnosis not present

## 2021-08-22 MED ORDER — AMLODIPINE BESYLATE 5 MG PO TABS
10.0000 mg | ORAL_TABLET | Freq: Once | ORAL | Status: AC
  Administered 2021-08-22: 10 mg via ORAL
  Filled 2021-08-22: qty 2

## 2021-08-22 NOTE — ED Triage Notes (Signed)
Pt states he does not have his blood pressure meds. Pt is homeless.  ?

## 2021-08-22 NOTE — ED Provider Notes (Signed)
?[facility] EMERGENCY DEPARTMENT ?Provider Note ? ? ?CSN: [id number] ?Arrival date & time: 08/22/21  2007 ? ?  ? ?History ? ?Chief Complaint  ?Patient presents with  ? Hypertension  ? ? ?Peter Meyer is a 61 y.o. male with medical history of hypertension.  Patient presents to ED requesting blood pressure medication, states that he is homeless and needs BP meds.  Patient denies any complaint.  This patient was seen in the department yesterday complaining of generalized body pain.  This patient has frequent ED use, has had 26 visits in the last 6 months.  Patient is noncompliant on blood pressure medication and states he has been unable to get it filled.  Patient reports that this morning he was supposed to meet [facility] here at the hospital to receive his blood pressure medication however they never showed up.  Patient denies headache, chest pain, shortness of breath, blurred vision, back pain.  Patient states "I just need my blood pressure medication". ? ? ?Hypertension ?Pertinent negatives include no chest pain, no headaches and no shortness of breath.  ? ?  ? ?Home Medications ?Prior to Admission medications   ?Medication Sig Start Date End Date Taking? Authorizing Provider  ?amLODipine (NORVASC) 10 MG tablet Take 1 tablet (10 mg total) by mouth daily. 08/21/21 09/20/21  Elson Areas, PA-C  ?lisinopril (ZESTRIL) 20 MG tablet Take 1 tablet (20 mg total) by mouth daily. 08/17/21   Dione Booze, MD  ?tamsulosin (FLOMAX) 0.4 MG CAPS capsule Take 1 capsule (0.4 mg total) by mouth daily. 07/28/21 08/27/21  Linwood Dibbles, MD  ?   ? ?Allergies    ?Patient has no known allergies.   ? ?Review of Systems   ?Review of Systems  ?Eyes:  Negative for visual disturbance.  ?Respiratory:  Negative for shortness of breath.   ?Cardiovascular:  Negative for chest pain.  ?Musculoskeletal:  Negative for back pain.  ?Neurological:  Negative for headaches.  ?All other systems reviewed and are negative. ? ?Physical Exam ?Updated  Vital Signs ?BP (!) 184/93 (BP Location: Right Arm)   Pulse 82   Temp 99.3 ?F (37.4 ?C) (Oral)   Resp 14   Ht 6' (1.829 m)   Wt 82.6 kg   SpO2 98%   BMI 24.70 kg/m?  ?Physical Exam ?Vitals and nursing note reviewed.  ?Constitutional:   ?   General: He is not in acute distress. ?   Appearance: Normal appearance. He is not ill-appearing, toxic-appearing or diaphoretic.  ?HENT:  ?   Head: Normocephalic and atraumatic.  ?   Nose: Nose normal. No congestion.  ?   Mouth/Throat:  ?   Mouth: Mucous membranes are moist.  ?   Pharynx: Oropharynx is clear.  ?Eyes:  ?   Extraocular Movements: Extraocular movements intact.  ?   Conjunctiva/sclera: Conjunctivae normal.  ?   Pupils: Pupils are equal, round, and reactive to light.  ?Cardiovascular:  ?   Rate and Rhythm: Normal rate and regular rhythm.  ?Pulmonary:  ?   Effort: Pulmonary effort is normal.  ?   Breath sounds: Normal breath sounds. No wheezing.  ?Abdominal:  ?   General: Abdomen is flat. Bowel sounds are normal.  ?   Palpations: Abdomen is soft.  ?   Tenderness: There is no abdominal tenderness.  ?Musculoskeletal:  ?   Cervical back: Normal range of motion and neck supple. No tenderness.  ?Skin: ?   General: Skin is warm and dry.  ?   Capillary  Refill: Capillary refill takes less than 2 seconds.  ?Neurological:  ?   Mental Status: He is alert and oriented to person, place, and time.  ?   GCS: GCS eye subscore is 4. GCS verbal subscore is 5. GCS motor subscore is 6.  ?   Cranial Nerves: Cranial nerves 2-12 are intact. No cranial nerve deficit.  ?   Sensory: Sensation is intact. No sensory deficit.  ?   Motor: Motor function is intact. No weakness.  ?   Coordination: Coordination is intact. Heel to [facility] Test normal.  ? ? ?ED Results / Procedures / Treatments   ?Labs ?(all labs ordered are listed, but only abnormal results are displayed) ?Labs Reviewed - No data to display ? ?EKG ?None ? ?Radiology ?No results found. ? ?Procedures ?Procedures  ? ?Medications  Ordered in ED ?Medications  ?amLODipine (NORVASC) tablet 10 mg (10 mg Oral Given 08/22/21 2139)  ? ? ?ED Course/ Medical Decision Making/ A&P ?  ?                        ?Medical Decision Making ?Risk ?Prescription drug management. ? ? ?61 year old male presents ED requesting blood pressure medication.  Please see HPI for further details ? ?On examination, patient afebrile, nontachycardic, not hypoxic.  Patient lung sounds clear bilaterally.  Patient neurological examination shows no focal neurodeficits.  Patient neck has full range of motion.  Patient abdomen soft compressible all 4 quadrants.  Patient nontoxic in appearance.  Patient able to follow commands. ? ?Patient denies any headache, back pain, blurred vision, chest pain, shortness of breath, nausea, vomiting. ? ?On chart review, it appears that the patient had blood pressure prescription sent in day prior by previous provider Langston Masker.  It appears that the patient has not picked up his medications. ? ?Here in the department, the patient was provided with 10 mg amlodipine.  The patient was provided with resources and instructed to attend the free clinic.  Patient was advised he will need to pick up his medication in the morning from [place].  Patient voiced understanding of these instructions.  Patient provided with return precautions and voiced understanding. ? ?Patient stable. ? ? ?Final Clinical Impression(s) / ED Diagnoses ?Final diagnoses:  ?Medication refill  ? ? ?Rx / DC Orders ?ED Discharge Orders   ? ? None  ? ?  ? ? ?  ?Al Decant, PA-C ?08/22/21 2302 ? ?  ?Linwood Dibbles, MD ?08/24/21 [phone] ? ?

## 2021-08-22 NOTE — ED Notes (Signed)
Pt left treatment area.

## 2021-08-22 NOTE — Discharge Instructions (Signed)
Return to the ED with any new symptoms such as chest pain, shortness of breath, headache or blurred vision ?Please pick up your blood pressure medication in the morning from [facility] ?Please review the attached resource guides that detail shelters and social services available to you. ?

## 2021-08-23 ENCOUNTER — Other Ambulatory Visit: Payer: Self-pay

## 2021-08-23 ENCOUNTER — Encounter (HOSPITAL_COMMUNITY): Payer: Self-pay

## 2021-08-23 DIAGNOSIS — I10 Essential (primary) hypertension: Secondary | ICD-10-CM | POA: Insufficient documentation

## 2021-08-23 DIAGNOSIS — Z79899 Other long term (current) drug therapy: Secondary | ICD-10-CM | POA: Diagnosis not present

## 2021-08-23 DIAGNOSIS — M79606 Pain in leg, unspecified: Secondary | ICD-10-CM | POA: Insufficient documentation

## 2021-08-23 DIAGNOSIS — F17210 Nicotine dependence, cigarettes, uncomplicated: Secondary | ICD-10-CM | POA: Diagnosis not present

## 2021-08-23 NOTE — ED Triage Notes (Signed)
Pt to ED with c/o right leg pain that started today, pt has been seen several times in the past week for various complaints, including leg pain. Pt reports walking here from [place], pt says he is homeless.  ?

## 2021-08-24 ENCOUNTER — Emergency Department (HOSPITAL_COMMUNITY)
Admission: EM | Admit: 2021-08-24 | Discharge: 2021-08-24 | Disposition: A | Discharge status: Home / Self Care | Payer: Medicaid Other | Attending: Student | Admitting: Student

## 2021-08-24 DIAGNOSIS — R52 Pain, unspecified: Secondary | ICD-10-CM

## 2021-08-24 MED ORDER — ACETAMINOPHEN 500 MG PO TABS
1000.0000 mg | ORAL_TABLET | Freq: Once | ORAL | Status: AC
  Administered 2021-08-24: 1000 mg via ORAL
  Filled 2021-08-24: qty 2

## 2021-08-24 NOTE — ED Provider Notes (Signed)
?[facility] ?Provider Note ? ?CSN: [id number] ?Arrival date & time: 08/23/21 2230 ? ?Chief Complaint(s) ?Leg Pain ? ?HPI ?Peter Meyer is a 61 y.o. male who presents emergency department for evaluation of leg pain.  Patient has been seen almost every day this week in the emergency department for various complaints.  He is homeless and states that he would like something to eat.  When I evaluate the patient, he does not specifically talk about leg pain he just says that "I hurt all over".  He states he has not taken any ibuprofen or Tylenol today.  He denies chest pain, shortness of breath, headache, fever or other systemic symptoms. ? ? ?Leg Pain ? ?Past Medical History ?Past Medical History:  ?Diagnosis Date  ? Hypertension   ? ?There are no problems to display for this patient. ? ?Home Medication(s) ?Prior to Admission medications   ?Medication Sig Start Date End Date Taking? Authorizing Provider  ?amLODipine (NORVASC) 10 MG tablet Take 1 tablet (10 mg total) by mouth daily. 08/21/21 09/20/21  Elson Areas, PA-C  ?lisinopril (ZESTRIL) 20 MG tablet Take 1 tablet (20 mg total) by mouth daily. 08/17/21   Dione Booze, MD  ?tamsulosin (FLOMAX) 0.4 MG CAPS capsule Take 1 capsule (0.4 mg total) by mouth daily. 07/28/21 08/27/21  Linwood Dibbles, MD  ?                                                                                                                                  ?Past Surgical History ?History reviewed. No pertinent surgical history. ?Family History ?No family history on file. ? ?Social History ?Social History  ? ?Tobacco Use  ? Smoking status: Every Day  ?  Types: Cigarettes  ? Smokeless tobacco: Never  ? Tobacco comments:  ?  3-4 cigarettes daily   ?Vaping Use  ? Vaping Use: Never used  ?Substance Use Topics  ? Alcohol use: Yes  ?  Alcohol/week: 2.0 standard drinks  ?  Types: 2 Cans of beer per week  ?  Comment: daily  ? Drug use: No  ? ?Allergies ?Patient has no known  allergies. ? ?Review of Systems ?Review of Systems  ?Musculoskeletal:  Positive for arthralgias and myalgias.  ? ?Physical Exam ?Vital Signs  ?I have reviewed the triage vital signs ?BP (!) 191/98 (BP Location: Right Arm)   Pulse 90   Temp 98.1 ?F (36.7 ?C) (Oral)   Resp 19   Ht 6' (1.829 m)   Wt 82.6 kg   SpO2 97%   BMI 24.70 kg/m?  ? ?Physical Exam ?Vitals and nursing note reviewed.  ?Constitutional:   ?   General: He is not in acute distress. ?   Appearance: He is well-developed.  ?HENT:  ?   Head: Normocephalic and atraumatic.  ?Eyes:  ?   Conjunctiva/sclera: Conjunctivae normal.  ?Cardiovascular:  ?   Rate and Rhythm: Normal rate and regular  rhythm.  ?   Heart sounds: No murmur heard. ?Pulmonary:  ?   Effort: Pulmonary effort is normal. No respiratory distress.  ?   Breath sounds: Normal breath sounds.  ?Abdominal:  ?   Palpations: Abdomen is soft.  ?   Tenderness: There is no abdominal tenderness.  ?Musculoskeletal:     ?   General: No swelling.  ?   Cervical back: Neck supple.  ?Skin: ?   General: Skin is warm and dry.  ?   Capillary Refill: Capillary refill takes less than 2 seconds.  ?Neurological:  ?   Mental Status: He is alert.  ?Psychiatric:     ?   Mood and Affect: Mood normal.  ? ? ?ED Results and Treatments ?Labs ?(all labs ordered are listed, but only abnormal results are displayed) ?Labs Reviewed - No data to display                                                                                                                       ? ?Radiology ?No results found. ? ?Pertinent labs & imaging results that were available during my care of the patient were reviewed by me and considered in my medical decision making (see MDM for details). ? ?Medications Ordered in ED ?Medications  ?acetaminophen (TYLENOL) tablet 1,000 mg (has no administration in time range)  ?                                                               ?                                                                     ?Procedures ?Procedures ? ?(including critical care time) ? ?Medical Decision Making / ED Course ? ? ?This patient presents to the ED for concern of pain, this involves an extensive number of treatment options, and is a complaint that carries with it a high risk of complications and morbidity.  The differential diagnosis includes arthritis, homelessness, malingering, fracture, cellulitis ? ?MDM: ?Emergency room for evaluation of "pain all over".  Physical exam is unremarkable and patient able to ambulate without difficulty.  Low suspicion for acute fracture.  Patient given Tylenol and symptoms improved.  Patient then discharged with instructions to set up with PCP. ? ? ?Additional history obtained: ? ?-External records from outside source obtained and reviewed including: Chart review including previous notes, labs, imaging, consultation notes ? ? ? ?Medicines ordered and prescription drug management: ?Meds ordered this encounter  ?Medications  ? acetaminophen (TYLENOL) tablet  1,000 mg  ?  ?-I have reviewed the patients home medicines and have made adjustments as needed ? ?Critical interventions ?none ? ? ?Social Determinants of Health:  ?Factors impacting patients care include: homelessness ? ? ?Reevaluation: ?After the interventions noted above, I reevaluated the patient and found that they have :improved ? ?Co morbidities that complicate the patient evaluation ? ?Past Medical History:  ?Diagnosis Date  ? Hypertension   ?  ? ? ?Dispostion: ?I considered admission for this patient, but symptoms improved with tylenol and suspect patient is here for secondary gain.  Patient then discharged. ? ? ? ? ?Final Clinical Impression(s) / ED Diagnoses ?Final diagnoses:  ?Pain  ? ? ? ?@PCDICTATION @ ? ?  ?Glendora ScoreKommor, Mack Thurmon, MD ?08/24/21 0200 ? ?

## 2021-08-25 ENCOUNTER — Emergency Department (HOSPITAL_COMMUNITY)
Admission: EM | Admit: 2021-08-25 | Discharge: 2021-08-25 | Disposition: A | Discharge status: Home / Self Care | Payer: Medicaid Other | Attending: Emergency Medicine | Admitting: Emergency Medicine

## 2021-08-25 ENCOUNTER — Other Ambulatory Visit: Payer: Self-pay

## 2021-08-25 ENCOUNTER — Encounter (HOSPITAL_COMMUNITY): Payer: Self-pay

## 2021-08-25 DIAGNOSIS — Z79899 Other long term (current) drug therapy: Secondary | ICD-10-CM | POA: Diagnosis not present

## 2021-08-25 DIAGNOSIS — I10 Essential (primary) hypertension: Secondary | ICD-10-CM | POA: Diagnosis not present

## 2021-08-25 DIAGNOSIS — R112 Nausea with vomiting, unspecified: Secondary | ICD-10-CM | POA: Diagnosis present

## 2021-08-25 LAB — CBC WITH DIFFERENTIAL/PLATELET
Abs Immature Granulocytes: 0.02 10*3/uL (ref 0.00–0.07)
Basophils Absolute: 0 10*3/uL (ref 0.0–0.1)
Basophils Relative: 1 %
Eosinophils Absolute: 0.1 10*3/uL (ref 0.0–0.5)
Eosinophils Relative: 1 %
HCT: 42.1 % (ref 39.0–52.0)
Hemoglobin: 13.1 g/dL (ref 13.0–17.0)
Immature Granulocytes: 0 %
Lymphocytes Relative: 32 %
Lymphs Abs: 2.1 10*3/uL (ref 0.7–4.0)
MCH: 27.3 pg (ref 26.0–34.0)
MCHC: 31.1 g/dL (ref 30.0–36.0)
MCV: 87.9 fL (ref 80.0–100.0)
Monocytes Absolute: 0.6 10*3/uL (ref 0.1–1.0)
Monocytes Relative: 9 %
Neutro Abs: 3.7 10*3/uL (ref 1.7–7.7)
Neutrophils Relative %: 57 %
Platelets: 253 10*3/uL (ref 150–400)
RBC: 4.79 MIL/uL (ref 4.22–5.81)
RDW: 13.8 % (ref 11.5–15.5)
WBC: 6.5 10*3/uL (ref 4.0–10.5)
nRBC: 0 % (ref 0.0–0.2)

## 2021-08-25 LAB — COMPREHENSIVE METABOLIC PANEL
ALT: 13 U/L (ref 0–44)
AST: 16 U/L (ref 15–41)
Albumin: 3.6 g/dL (ref 3.5–5.0)
Alkaline Phosphatase: 55 U/L (ref 38–126)
Anion gap: 10 (ref 5–15)
BUN: 17 mg/dL (ref 8–23)
CO2: 23 mmol/L (ref 22–32)
Calcium: 8.9 mg/dL (ref 8.9–10.3)
Chloride: 105 mmol/L (ref 98–111)
Creatinine, Ser: 1.27 mg/dL — ABNORMAL HIGH (ref 0.61–1.24)
GFR, Estimated: >60 mL/min (ref >60)
Glucose, Bld: 97 mg/dL (ref 70–99)
Potassium: 3.4 mmol/L — ABNORMAL LOW (ref 3.5–5.1)
Sodium: 138 mmol/L (ref 135–145)
Total Bilirubin: 0.4 mg/dL (ref 0.3–1.2)
Total Protein: 8 g/dL (ref 6.5–8.1)

## 2021-08-25 LAB — URINALYSIS, ROUTINE W REFLEX MICROSCOPIC
Bacteria, UA: NONE SEEN
Bilirubin Urine: NEGATIVE
Glucose, UA: NEGATIVE mg/dL
Hgb urine dipstick: NEGATIVE
Ketones, ur: NEGATIVE mg/dL
Nitrite: NEGATIVE
Protein, ur: >=300 mg/dL — AB
Specific Gravity, Urine: 1.018 (ref 1.005–1.030)
pH: 5 (ref 5.0–8.0)

## 2021-08-25 MED ORDER — ONDANSETRON HCL 4 MG PO TABS: 4.0000 mg | ORAL_TABLET | Freq: Four times a day (QID) | ORAL | 0 refills | Status: AC

## 2021-08-25 MED ORDER — ONDANSETRON 4 MG PO TBDP
4.0000 mg | ORAL_TABLET | Freq: Once | ORAL | Status: AC
  Administered 2021-08-25: 4 mg via ORAL
  Filled 2021-08-25: qty 1

## 2021-08-25 NOTE — ED Triage Notes (Signed)
Pt states he drank some pink lemonade earlier and it made him vomit. States his stomach is now bothering him.   ? ? ?Pt is hypertensive in triage, is noncompliant with medication  ?

## 2021-08-25 NOTE — ED Provider Notes (Signed)
?[facility] ?Provider Note ? ? ?CSN: [id number] ?Arrival date & time: 08/25/21  1944 ? ?  ? ?History ? ?Chief Complaint  ?Patient presents with  ? Nausea  ? ? ?Peter Meyer is a 61 y.o. male.  Past medical history of hypertension who presents to the emergency department with nausea. ? ?Patient states that he ate at the shelter today and had some pink lemonade.  He states after this he had an episode of nausea with vomiting.  He states that he still has slight nausea.  He denies any abdominal pain.  Denies further episodes of vomiting, diarrhea.  Denies urinary symptoms, penile discharge.  Is tolerating p.o.  This is the patient's sixth visit in 8 days. ? ?HPI ? ?  ? ?Home Medications ?Prior to Admission medications   ?Medication Sig Start Date End Date Taking? Authorizing Provider  ?amLODipine (NORVASC) 10 MG tablet Take 1 tablet (10 mg total) by mouth daily. 08/21/21 09/20/21  Fransico Meadow, PA-C  ?lisinopril (ZESTRIL) 20 MG tablet Take 1 tablet (20 mg total) by mouth daily. [id number]   Delora Fuel, MD  ?tamsulosin (FLOMAX) 0.4 MG CAPS capsule Take 1 capsule (0.4 mg total) by mouth daily. 07/28/21 08/27/21  Dorie Rank, MD  ?   ? ?Allergies    ?Patient has no known allergies.   ? ?Review of Systems   ?Review of Systems  ?Constitutional:  Negative for fever.  ?Gastrointestinal:  Positive for nausea and vomiting. Negative for abdominal pain and diarrhea.  ?All other systems reviewed and are negative. ? ?Physical Exam ?Updated Vital Signs ?BP (!) 202/106 (BP Location: Right Arm)   Pulse 74   Temp 98.1 ?F (36.7 ?C) (Oral)   Resp 19   Ht 6' (1.829 m)   Wt 83 kg   SpO2 100%   BMI 24.82 kg/m?  ?Physical Exam ?Vitals and nursing note reviewed.  ?Constitutional:   ?   General: He is not in acute distress. ?   Appearance: Normal appearance. He is not ill-appearing or toxic-appearing.  ?HENT:  ?   Head: Normocephalic and atraumatic.  ?   Mouth/Throat:  ?   Mouth: Mucous membranes are moist.  ?    Pharynx: Oropharynx is clear.  ?Eyes:  ?   General: No scleral icterus. ?   Extraocular Movements: Extraocular movements intact.  ?Pulmonary:  ?   Effort: Pulmonary effort is normal. No respiratory distress.  ?Abdominal:  ?   General: Bowel sounds are normal. There is no distension.  ?   Palpations: Abdomen is soft.  ?   Tenderness: There is no abdominal tenderness. There is no right CVA tenderness, left CVA tenderness, guarding or rebound.  ?Musculoskeletal:     ?   General: Normal range of motion.  ?   Cervical back: Neck supple.  ?Skin: ?   General: Skin is warm and dry.  ?   Findings: No rash.  ?Neurological:  ?   General: No focal deficit present.  ?   Mental Status: He is alert. Mental status is at baseline.  ?Psychiatric:     ?   Mood and Affect: Mood normal.     ?   Behavior: Behavior normal.     ?   Thought Content: Thought content normal.     ?   Judgment: Judgment normal.  ? ? ?ED Results / Procedures / Treatments   ?Labs ?(all labs ordered are listed, but only abnormal results are displayed) ?Labs Reviewed  ?COMPREHENSIVE METABOLIC  PANEL - Abnormal; Notable for the following components:  ?    Result Value  ? Potassium 3.4 (*)   ? Creatinine, Ser 1.27 (*)   ? All other components within normal limits  ?URINALYSIS, ROUTINE W REFLEX MICROSCOPIC - Abnormal; Notable for the following components:  ? Protein, ur >=300 (*)   ? Leukocytes,Ua TRACE (*)   ? All other components within normal limits  ?CBC WITH DIFFERENTIAL/PLATELET  ? ?EKG ?None ? ?Radiology ?No results found. ? ?Procedures ?Procedures  ? ?Medications Ordered in ED ?Medications  ?ondansetron (ZOFRAN-ODT) disintegrating tablet 4 mg (has no administration in time range)  ? ? ?ED Course/ Medical Decision Making/ A&P ?  ?                        ?Medical Decision Making ?Amount and/or Complexity of Data Reviewed ?Labs: ordered. ? ?Risk ?Prescription drug management. ? ?This patient presents to the ED for concern of vomiting, this involves an extensive  number of treatment options, and is a complaint that carries with it a high risk of complications and morbidity.   ? ?Co morbidities that complicate the patient evaluation ?Hypertension ? ?Additional history obtained:  ?Additional history obtained from: None ?External records from outside source obtained and reviewed including: Previous ED visits ? ?Lab Results: ?I personally ordered, reviewed, and interpreted labs. ?Pertinent results include: ?CBC within normal limits ?CMP with slightly elevated creatinine ?UA with elevated protein ? ?Medications  ?I ordered medication including Zofran for nausea ?Reevaluation of the patient after medication shows that patient improved ? ?ED Course: ? ?61 year old male who presents emergency department with an episode of nausea and vomiting.  He is in no acute distress.  Well-appearing.  His abdomen is soft and nontender.  No evidence of an acute abdomen.  Tolerates p.o. here in the emergency department.  No urinary symptoms. ?UA without evidence of UTI, no urinary symptoms, doubt cystitis, pyelonephritis. ?Transaminitis, elevated bilirubin, right upper quadrant abdominal pain concerning for acute hepatobiliary disease. ?Symptoms not consistent with pancreatitis. ?Symptoms not consistent with bowel obstruction, viral gastroenteritis, cyclical vomiting syndrome, diverticulitis. ? ?Likely related to food he ate earlier today.  He is only had 1 episode of nausea and vomiting.  He is tolerating p.o. here in the emergency department.  With normal work-up, likely a component of malingering as he has been here almost every day this week.  Discussed his blood pressure and needing to follow-up with a primary care provider.  He has been given resources for community health clinic.  Given return precautions. ? ?After consideration of the diagnostic results and the patients response to treatment, I feel that the patent would benefit from discharge. ?The patient has been appropriately medically  screened and/or stabilized in the ED. I have low suspicion for any other emergent medical condition which would require further screening, evaluation or treatment in the ED or require inpatient management. The patient is overall well appearing and non-toxic in appearance. They are hemodynamically stable at time of discharge.   ?Final Clinical Impression(s) / ED Diagnoses ?Final diagnoses:  ?Nausea and vomiting, unspecified vomiting type  ? ? ?Rx / DC Orders ?ED Discharge Orders   ? ?      Ordered  ?  ondansetron (ZOFRAN) 4 MG tablet  Every 6 hours       ? 08/25/21 2242  ? ?  ?  ? ?  ? ? ?  ?Mickie Hillier, PA-C ?08/25/21 2254 ? ?  ?Zammit,  Broadus John, MD ?08/26/21 1234 ? ?

## 2021-08-25 NOTE — Discharge Instructions (Addendum)
You were seen in the emergency department today for an episode of vomiting.  You may have eaten something that upset your stomach.  I am prescribing you some Zofran that you can use every 6 hours for nausea.  Please continue to eat and drink as tolerated.  Please return to the emergency department for any worsening symptoms. ?

## 2021-08-26 ENCOUNTER — Other Ambulatory Visit: Payer: Self-pay

## 2021-08-26 ENCOUNTER — Encounter (HOSPITAL_COMMUNITY): Payer: Self-pay | Admitting: Emergency Medicine

## 2021-08-26 DIAGNOSIS — I10 Essential (primary) hypertension: Secondary | ICD-10-CM | POA: Insufficient documentation

## 2021-08-26 DIAGNOSIS — R112 Nausea with vomiting, unspecified: Secondary | ICD-10-CM | POA: Insufficient documentation

## 2021-08-26 DIAGNOSIS — Z79899 Other long term (current) drug therapy: Secondary | ICD-10-CM | POA: Insufficient documentation

## 2021-08-26 DIAGNOSIS — R519 Headache, unspecified: Secondary | ICD-10-CM | POA: Diagnosis present

## 2021-08-26 DIAGNOSIS — Z5900 Homelessness unspecified: Secondary | ICD-10-CM | POA: Diagnosis not present

## 2021-08-26 NOTE — ED Triage Notes (Signed)
Pt c/o n/v, headache and legs hurting started today. Ambulatory. Nad at this time. Pt here yesterday for same.  ?

## 2021-08-26 NOTE — ED Notes (Signed)
Pt has not vomited while waiting in triage.  ?

## 2021-08-27 ENCOUNTER — Emergency Department (HOSPITAL_COMMUNITY)
Admission: EM | Admit: 2021-08-27 | Discharge: 2021-08-27 | Disposition: A | Discharge status: Home / Self Care | Payer: Medicaid Other | Attending: Emergency Medicine | Admitting: Emergency Medicine

## 2021-08-27 DIAGNOSIS — I10 Essential (primary) hypertension: Secondary | ICD-10-CM

## 2021-08-27 DIAGNOSIS — Z5900 Homelessness unspecified: Secondary | ICD-10-CM

## 2021-08-27 DIAGNOSIS — Z765 Malingerer [conscious simulation]: Secondary | ICD-10-CM

## 2021-08-27 MED ORDER — AMLODIPINE BESYLATE 10 MG PO TABS: 10.0000 mg | ORAL_TABLET | Freq: Every day | ORAL | 0 refills | Status: AC

## 2021-08-27 MED ORDER — LISINOPRIL 20 MG PO TABS: 20.0000 mg | ORAL_TABLET | Freq: Every day | ORAL | 0 refills | Status: AC

## 2021-08-27 NOTE — ED Provider Notes (Signed)
? ?[facility] EMERGENCY DEPARTMENT  ?Provider Note ? ?CSN: [id number] ?Arrival date & time: 08/26/21 1819 ? ?History ?Chief Complaint  ?Patient presents with  ? Emesis  ? ? ?Peter Meyer is a 61 y.o. male with history of poorly controlled HTN, homelessness and frequent ED visits for a variety of complaints returns for evaluation of headache. He was also apparently complaining of nausea and vomiting in triage but due to prolonged wait times he has been here several hours without vomiting. He has been given Rx for his BP meds several times in recent days. He claims the pharmacy brought his medications and left them here but I cannot confirm that. No new complaints tonight.  ? ? ?Home Medications ?Prior to Admission medications   ?Medication Sig Start Date End Date Taking? Authorizing Provider  ?amLODipine (NORVASC) 10 MG tablet Take 1 tablet (10 mg total) by mouth daily. 08/27/21 09/26/21  Pollyann Savoy, MD  ?lisinopril (ZESTRIL) 20 MG tablet Take 1 tablet (20 mg total) by mouth daily. 08/27/21   Pollyann Savoy, MD  ?ondansetron (ZOFRAN) 4 MG tablet Take 1 tablet (4 mg total) by mouth every 6 (six) hours. 08/25/21   Cristopher Peru, PA-C  ?tamsulosin (FLOMAX) 0.4 MG CAPS capsule Take 1 capsule (0.4 mg total) by mouth daily. 07/28/21 08/27/21  Linwood Dibbles, MD  ? ? ? ?Allergies    ?Patient has no known allergies. ? ? ?Review of Systems   ?Review of Systems ?Please see HPI for pertinent positives and negatives ? ?Physical Exam ?BP (!) 232/115 (BP Location: Right Arm)   Pulse 73   Temp 98.2 ?F (36.8 ?C) (Oral)   Resp 17   SpO2 100%  ? ?Physical Exam ?Vitals and nursing note reviewed.  ?HENT:  ?   Head: Normocephalic.  ?   Nose: Nose normal.  ?Eyes:  ?   Extraocular Movements: Extraocular movements intact.  ?Pulmonary:  ?   Effort: Pulmonary effort is normal.  ?Musculoskeletal:     ?   General: Normal range of motion.  ?   Cervical back: Neck supple.  ?Skin: ?   Findings: No rash (on exposed skin).  ?Neurological:   ?   Mental Status: He is alert and oriented to person, place, and time.  ?Psychiatric:     ?   Mood and Affect: Mood normal.  ? ? ?ED Results / Procedures / Treatments   ?EKG ?None ? ?Procedures ?Procedures ? ?Medications Ordered in the ED ?Medications - No data to display ? ?Initial Impression and Plan ? Patient with homelessness and malingering also here with HTn similar to several recent ED visits, labs done yesterday did not show any acute change. He has been sleeping in ED for several hours. Will d/c with another refill of his BP meds, encouraged to pick them up from the pharmacy himself and not try to get them delivered to the ER.  ? ?ED Course  ? ?  ? ? ?MDM Rules/Calculators/A&P ?Medical Decision Making ?Problems Addressed: ?Chronic hypertension: chronic illness or injury ?Homelessness: chronic illness or injury ?Malingering: chronic illness or injury ? ?Risk ?Prescription drug management. ?Diagnosis or treatment significantly limited by social determinants of health. ? ? ? ?Final Clinical Impression(s) / ED Diagnoses ?Final diagnoses:  ?Chronic hypertension  ?Homelessness  ?Malingering  ? ? ?Rx / DC Orders ?ED Discharge Orders   ? ?      Ordered  ?  amLODipine (NORVASC) 10 MG tablet  Daily       ?  08/27/21 0305  ?  lisinopril (ZESTRIL) 20 MG tablet  Daily       ?Note to Pharmacy: Please do not deliver to the ER  ? 08/27/21 0305  ? ?  ?  ? ?  ? ?  ?Pollyann Savoy, MD ?08/27/21 [phone] ? ?

## 2021-08-28 ENCOUNTER — Other Ambulatory Visit: Payer: Self-pay

## 2021-08-28 ENCOUNTER — Encounter (HOSPITAL_COMMUNITY): Payer: Self-pay | Admitting: *Deleted

## 2021-08-28 DIAGNOSIS — R519 Headache, unspecified: Secondary | ICD-10-CM | POA: Diagnosis present

## 2021-08-28 DIAGNOSIS — I10 Essential (primary) hypertension: Secondary | ICD-10-CM | POA: Insufficient documentation

## 2021-08-28 DIAGNOSIS — R109 Unspecified abdominal pain: Secondary | ICD-10-CM | POA: Insufficient documentation

## 2021-08-28 DIAGNOSIS — G8929 Other chronic pain: Secondary | ICD-10-CM | POA: Insufficient documentation

## 2021-08-28 DIAGNOSIS — F17210 Nicotine dependence, cigarettes, uncomplicated: Secondary | ICD-10-CM | POA: Diagnosis not present

## 2021-08-28 NOTE — ED Triage Notes (Signed)
Pt c/o headache and pain to his kidneys; pt ambulatory and was seen here yesterday  ?

## 2021-08-29 ENCOUNTER — Emergency Department (HOSPITAL_COMMUNITY)
Admission: EM | Admit: 2021-08-29 | Discharge: 2021-08-29 | Disposition: A | Discharge status: Home / Self Care | Payer: Medicaid Other | Attending: Emergency Medicine | Admitting: Emergency Medicine

## 2021-08-29 DIAGNOSIS — R519 Headache, unspecified: Secondary | ICD-10-CM

## 2021-08-29 NOTE — ED Provider Notes (Signed)
?[facility] DEPT ?[facility] Emergency Department ?Provider Note ?MRN:  [id number]  ?Arrival date & time: 08/29/21    ? ?Chief Complaint   ?Headache ?  ?History of Present Illness   ?Peter Meyer is a 61 y.o. year-old male with a history of hypertension presenting to the ED with chief complaint of headache. ? ?Patient endorsing headache and abdominal pain, unsure when this started.  Long history of these complaints. ? ?Review of Systems  ?A thorough review of systems was obtained and all systems are negative except as noted in the HPI and PMH.  ? ?Patient's Health History   ? ?Past Medical History:  ?Diagnosis Date  ? Hypertension   ?  ?History reviewed. No pertinent surgical history.  ?History reviewed. No pertinent family history.  ?Social History  ? ?Socioeconomic History  ? Marital status: Legally Separated  ?  Spouse name: Not on file  ? Number of children: Not on file  ? Years of education: Not on file  ? Highest education level: Not on file  ?Occupational History  ? Not on file  ?Tobacco Use  ? Smoking status: Every Day  ?  Types: Cigarettes  ? Smokeless tobacco: Never  ? Tobacco comments:  ?  3-4 cigarettes daily   ?Vaping Use  ? Vaping Use: Never used  ?Substance and Sexual Activity  ? Alcohol use: Yes  ?  Alcohol/week: 2.0 standard drinks  ?  Types: 2 Cans of beer per week  ?  Comment: daily  ? Drug use: No  ? Sexual activity: Not on file  ?Other Topics Concern  ? Not on file  ?Social History Narrative  ? Not on file  ? ?Social Determinants of Health  ? ?Financial Resource Strain: Not on file  ?Food Insecurity: Not on file  ?Transportation Needs: Not on file  ?Physical Activity: Not on file  ?Stress: Not on file  ?Social Connections: Not on file  ?Intimate Partner Violence: Not on file  ?  ? ?Physical Exam  ? ?Vitals:  ? 08/28/21 2047 08/29/21 0245  ?BP: (!) 206/99 (!) 184/91  ?Pulse: 71 68  ?Resp: 14 16  ?Temp: 98.3 ?F (36.8 ?C) 98 ?F (36.7 ?C)  ?SpO2: 100% 99%  ?  ?CONSTITUTIONAL:  Well-appearing, NAD ?NEURO/PSYCH:  Alert and oriented x 3, no focal deficits ?EYES:  eyes equal and reactive ?ENT/NECK:  no LAD, no JVD ?CARDIO: Regular rate, well-perfused, normal S1 and S2 ?PULM:  CTAB no wheezing or rhonchi ?GI/GU:  non-distended, non-tender ?MSK/SPINE:  No gross deformities, no edema ?SKIN:  no rash, atraumatic ? ? ?*Additional and/or pertinent findings included in MDM below ? ?Diagnostic and Interventional Summary  ? ? EKG Interpretation ? ?Date/Time:    ?Ventricular Rate:    ?PR Interval:    ?QRS Duration:   ?QT Interval:    ?QTC Calculation:   ?R Axis:     ?Text Interpretation:   ?  ? ?  ? ?Labs Reviewed - No data to display  ?No orders to display  ?  ?Medications - No data to display  ? ?Procedures  /  Critical Care ?Procedures ? ?ED Course and Medical Decision Making  ?Initial Impression and Ddx ?Frequent ED visitor for chronic complaints.  Uncontrolled high blood pressure, not taking any medicines.  He is in no acute distress, sleeping comfortably, easily wakes.  Recent work-up showing generally reassuring labs, slow uptrend in creatinine recently.  I see no indication for repeat testing or admission at this time, chronic complaints without  much change.  Anticipating discharge. ? ?Past medical/surgical history that increases complexity of ED encounter: None ? ?Interpretation of Diagnostics ?Not applicable ? ?Patient Reassessment and Ultimate Disposition/Management ?Discharge. ? ?Patient management required discussion with the following services or consulting groups:  None ? ?Complexity of Problems Addressed ?Chronic illness with exacerbation ? ?Additional Data Reviewed and Analyzed ?Further history obtained from: ?None ? ?Additional Factors Impacting ED Encounter Risk ?None ? ?[name], MD ?[facility] Emergency Medicine ?[facility] [email or web address] ? ?Final Clinical Impressions(s) / ED Diagnoses  ? ?  ICD-10-CM   ?1. Chronic nonintractable headache, unspecified  headache type  R51.9   ? G89.29   ?  ?  ?ED Discharge Orders   ? ? None  ? ?  ?  ? ?Discharge Instructions Discussed with and Provided to Patient:  ? ? ? ?Discharge Instructions   ? ?  ?You were evaluated in the Emergency Department and after careful evaluation, we did not find any emergent condition requiring admission or further testing in the hospital. ? ?Your exam/testing today was overall reassuring. ? ?Please return to the Emergency Department if you experience any worsening of your condition.  Thank you for allowing [place] to be a part of your care. ? ? ? ? ? ?  ?Sabas Sous, MD ?08/29/21 [phone] ? ?

## 2021-08-29 NOTE — Congregational Nurse Program (Signed)
?  Dept: [phone] ? ? ?Congregational Nurse Program Note ? ?Date of Encounter: 08/29/2021 ? ?Past Medical History: ?Past Medical History:  ?Diagnosis Date  ? Hypertension   ? ? ?Encounter Details: ? CNP Questionnaire - 08/29/21 1601   ? ?  ? Questionnaire  ? Do you give verbal consent to treat you today? No   ? Location Patient Served  [facility]   ? Visit Setting Church or Organization   ? Patient Status Homeless   ? Insurance Uninsured ([place] [employer] Connects/Self-Pay)   ? Insurance Referral N/A   ? Medication Have Medication Insecurities;Provided Medication Assistance   ? Medical Provider No   ? Screening Referrals N/A   ? Medical Referral Drug/Alcohol Services   ? Medical Appointment Made N/A   ? Food Have Food Insecurities;Referred to Food Pantry   ? Transportation Need transportation assistance;Provided transportation assistance   ? Housing/Utilities No permanent housing   ? Interpersonal Safety N/A   ? Intervention Advocate;Educate;Counsel;Support   ? ED Visit Averted Yes   ? Life-Saving Intervention Made N/A   ? ?  ?  ? ?  ? ? ?Client came into [facility] today seeking help. ?Last seen today in ER for complaints of headache was evaluated and discharged. He is uninsured and currently homeless. ?Client was seen in [place] ER on 08/27/21 and was prescribed Lisinopril and Amlodipine and was unable to fill those due to financial need.  ?Called [employer] and faxed voucher for a one time assistance from [facility] and community nursing program. Was able to pick up Medication for client today. ? ?Client also worked with [employer] today program manager and client states he needs help with a place to live and to get treatment for addiction. [facility] was called and bed available. [facility] called and transportation to [facility] arranged will pick up from [facility] today at 3:45pm ? ?Client shopped [facility] [employer] planner for non perishable  type foods and some personal care items. We were also able to provide him with several pairs of clean socks and undergarments. ?Client had stored his clothing and belongings at [facility]. Staff were able to obtain belongings for client to [facility].  ?Client provided his medications and water and crackers to take his blood pressure medications as he has not had any today per His request. Client packed his medications in his belongings bag labeled.  ?Client given Care connect business card with our main number ? ? ? ?Francee Nodal RN ?[employer] [employer] [facility] ?

## 2021-08-29 NOTE — Discharge Instructions (Signed)
You were evaluated in the Emergency Department and after careful evaluation, we did not find any emergent condition requiring admission or further testing in the hospital.  Your exam/testing today was overall reassuring.  Please return to the Emergency Department if you experience any worsening of your condition.  Thank you for allowing us to be a part of your care.  

## 2023-03-02 IMAGING — DX DG CHEST 1V PORT
1 series · 1 of 1 positions shown · non-contrast
Comparison: None.

CLINICAL DATA: Hypertension

EXAM:
PORTABLE CHEST 1 VIEW

[chest ap]
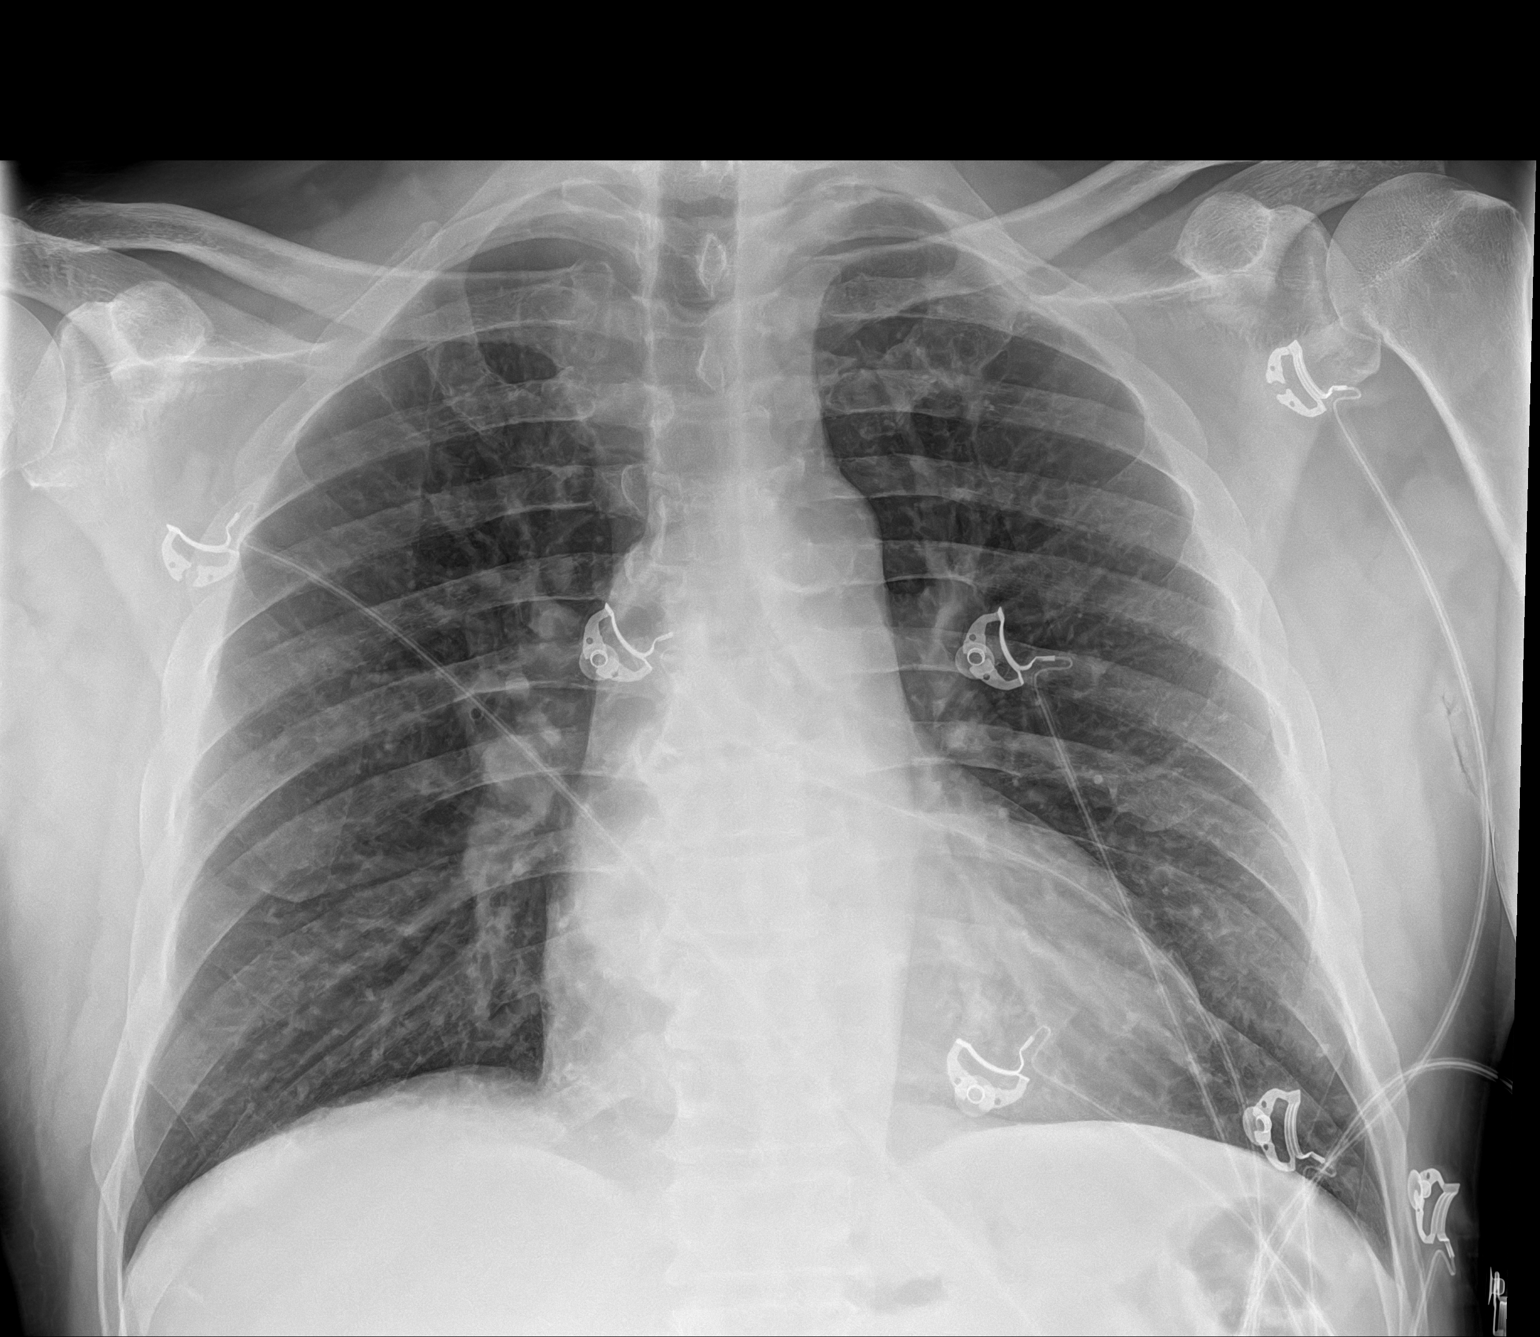

[1 of 1 positions shown; findings below may reference images not displayed]

FINDINGS: The heart size and mediastinal contours are within normal limits.
Both lungs are clear. The visualized skeletal structures are
unremarkable.
IMPRESSION: No active disease.

## 2023-05-04 IMAGING — CT CT ABD-PELV W/ CM
2 of 5 series · 16 of 46 positions shown, 18 images · IV contrast (Omnipaque or Isovue)
Comparison: None.

CLINICAL DATA: Lower abdominal pain, vomiting

EXAM:
CT ABDOMEN AND PELVIS WITH CONTRAST
TECHNIQUE: Multidetector CT imaging of the abdomen and pelvis was performed
using the standard protocol following bolus administration of
intravenous contrast.

[Series 2: axial st · axial · 0.70mm/px · z∈[-399,+1]mm · 13 of 90 slices shown, 15 images]
[im 5/90  soft-tissue]
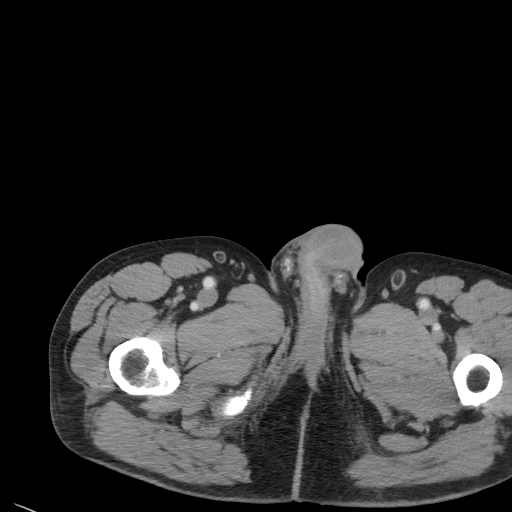
[im 5/90  bone]
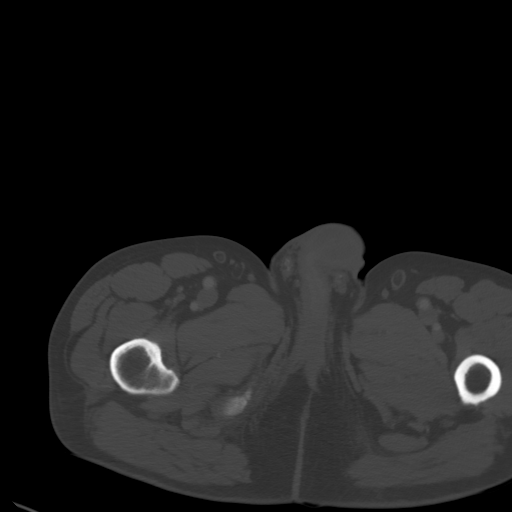
[im 10/90  soft-tissue]
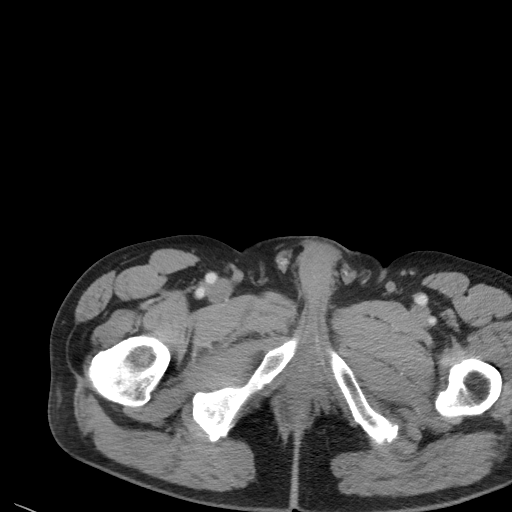
[im 20/90  soft-tissue]
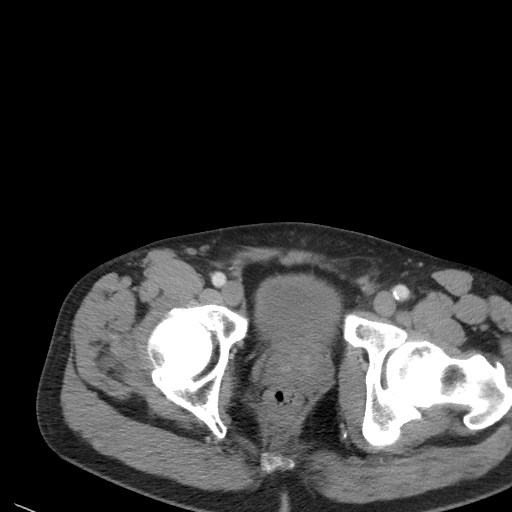
[im 25/90  soft-tissue]
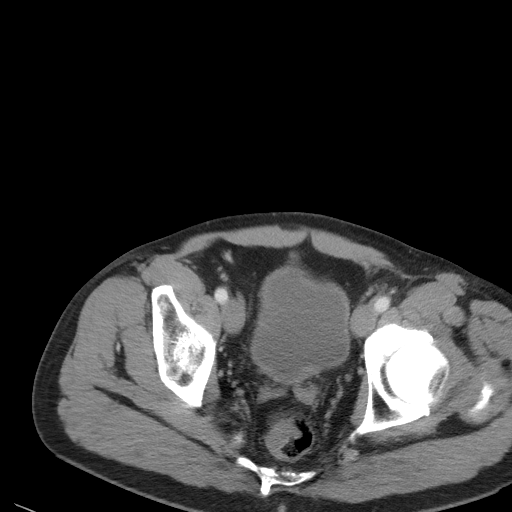
[im 30/90  soft-tissue]
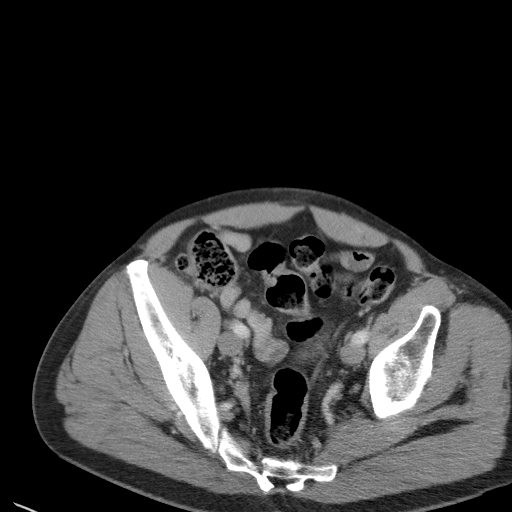
[im 40/90  soft-tissue]
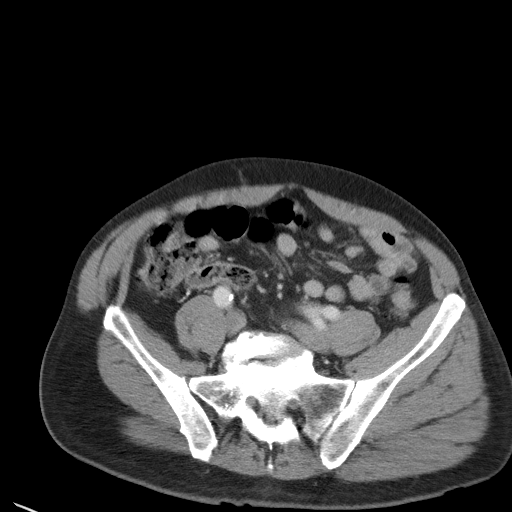
[im 45/90  soft-tissue]
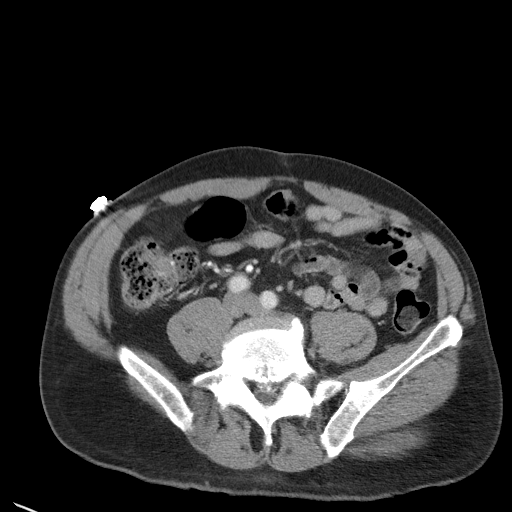
[im 50/90  soft-tissue]
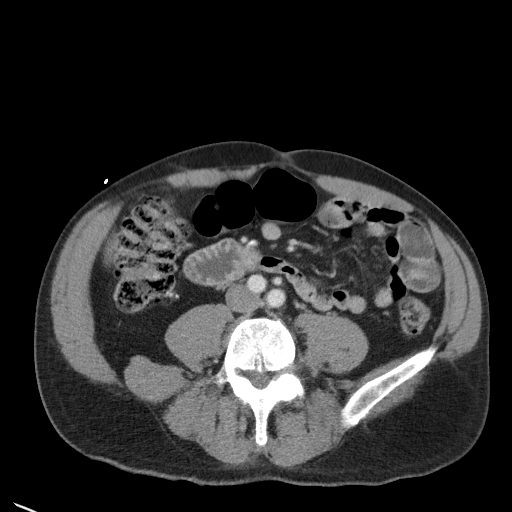
[im 60/90  soft-tissue]
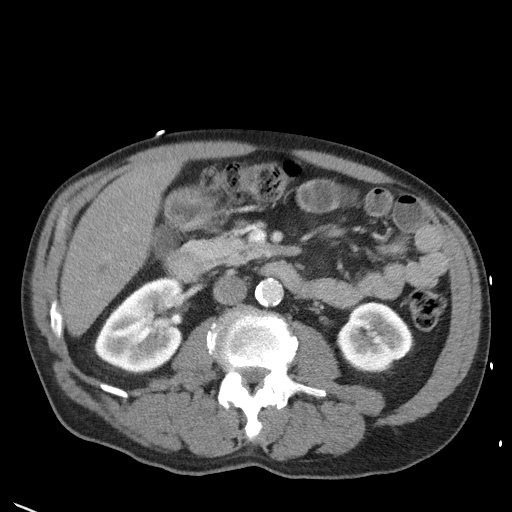
[im 60/90  bone]
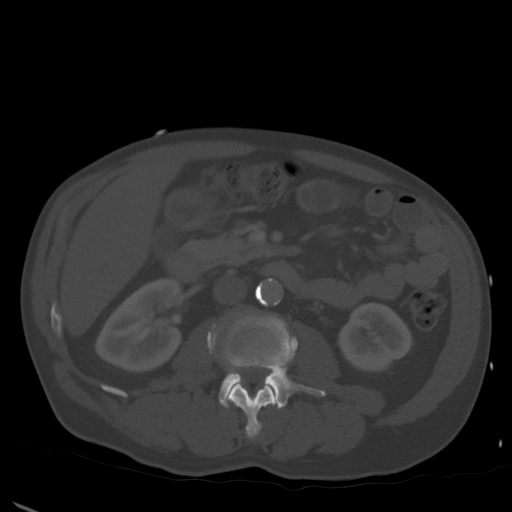
[im 65/90  soft-tissue]
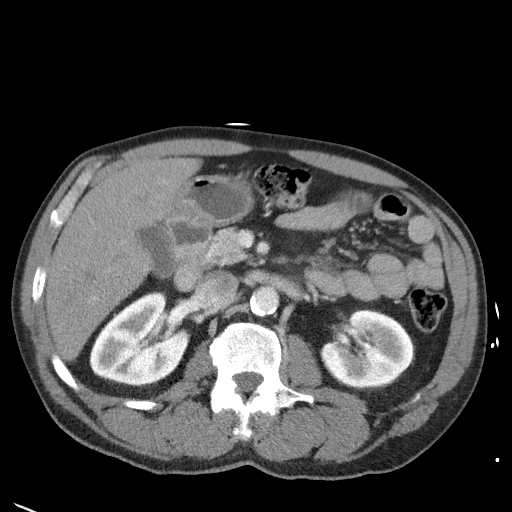
[im 70/90  soft-tissue]
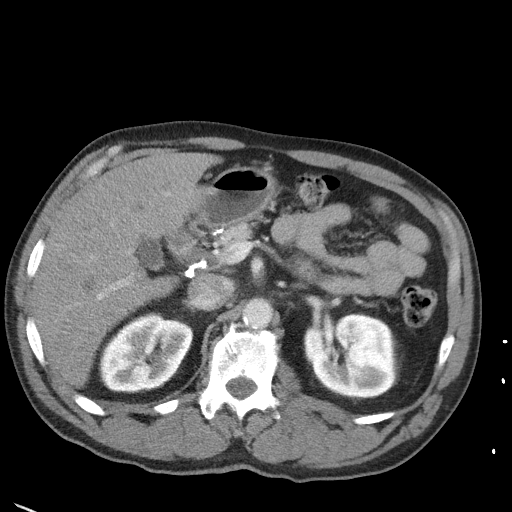
[im 80/90  soft-tissue]
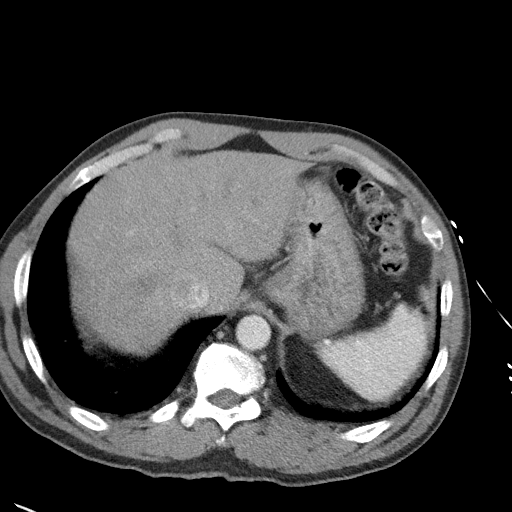
[im 85/90  soft-tissue]
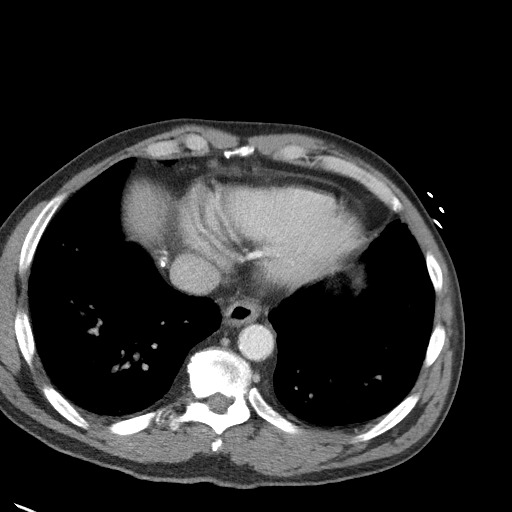

[Series 5: coronal st · coronal · 0.78mm/px · 3 of 91 slices shown]
[im 31/91  soft-tissue]
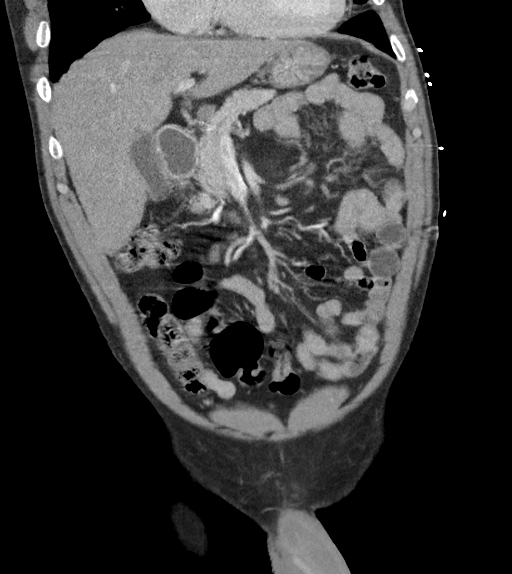
[im 41/91  soft-tissue]
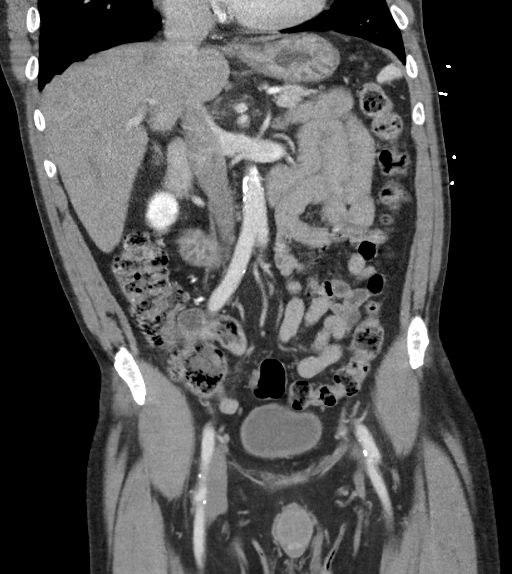
[im 51/91  soft-tissue]
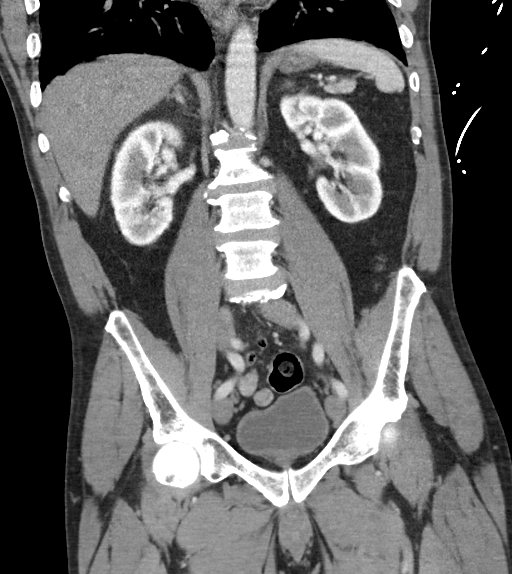

[16 of 46 positions shown; findings below may reference images not displayed]

RADIATION DOSE REDUCTION: This exam was performed according to the
departmental dose-optimization program which includes automated
exposure control, adjustment of the mA and/or kV according to
patient size and/or use of iterative reconstruction technique.

CONTRAST:  100mL OMNIPAQUE IOHEXOL 300 MG/ML  SOLN
FINDINGS: Lower chest: No acute abnormality.

Hepatobiliary: No focal liver abnormality is seen. No gallstones,
gallbladder wall thickening, or biliary dilatation. Multiple
calcified lymph nodes within the porta hepatis likely relate to old
granulomatous disease.

Pancreas: Unremarkable

Spleen: Unremarkable

Adrenals/Urinary Tract: Adrenal glands are unremarkable. Kidneys are
normal, without renal calculi, focal lesion, or hydronephrosis.
Bladder is unremarkable.

Stomach/Bowel: Stomach is within normal limits. Appendix appears
normal. No evidence of bowel wall thickening, distention, or
inflammatory changes. No free intraperitoneal gas.

Vascular/Lymphatic: Aortic atherosclerosis. No enlarged abdominal or
pelvic lymph nodes.

Reproductive: Prostate is unremarkable.

Other: Tiny fat containing left inguinal hernia. No abdominopelvic
ascites.

Musculoskeletal: Degenerative changes are seen within the lumbar
spine. No lytic or blastic bone lesions. No acute bone abnormality.
IMPRESSION: No acute intra-abdominal pathology identified. No definite
radiographic explanation for the patient's reported symptoms.

Aortic Atherosclerosis ([id number]).

## 2023-06-23 IMAGING — CT CT CERVICAL SPINE W/O CM
3 of 4 series · 11 of 33 positions shown, 13 images · non-contrast
Comparison: None.

CLINICAL DATA: Status post fall.



[Series 5: sagittal bone · sagittal · 0.27mm/px · 5 of 70 slices shown, 6 images]
[im 24/70  bone]
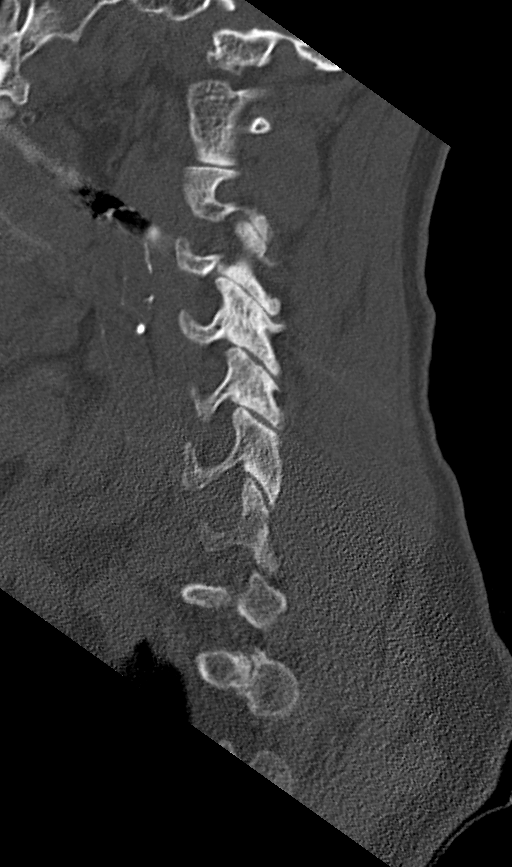
[im 29/70  bone]
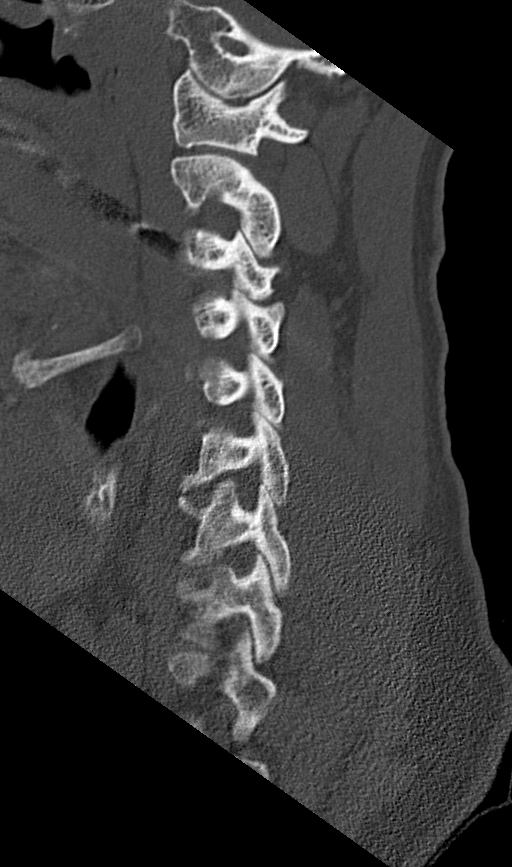
[im 35/70  soft-tissue]
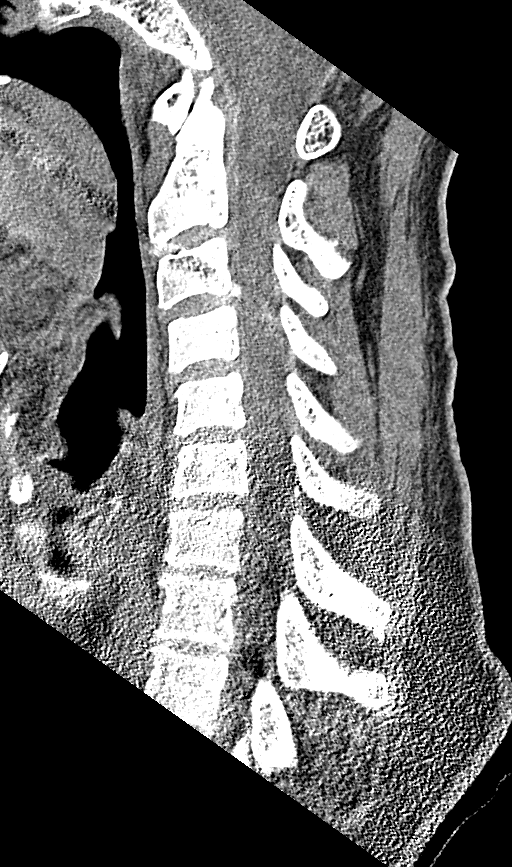
[im 35/70  bone]
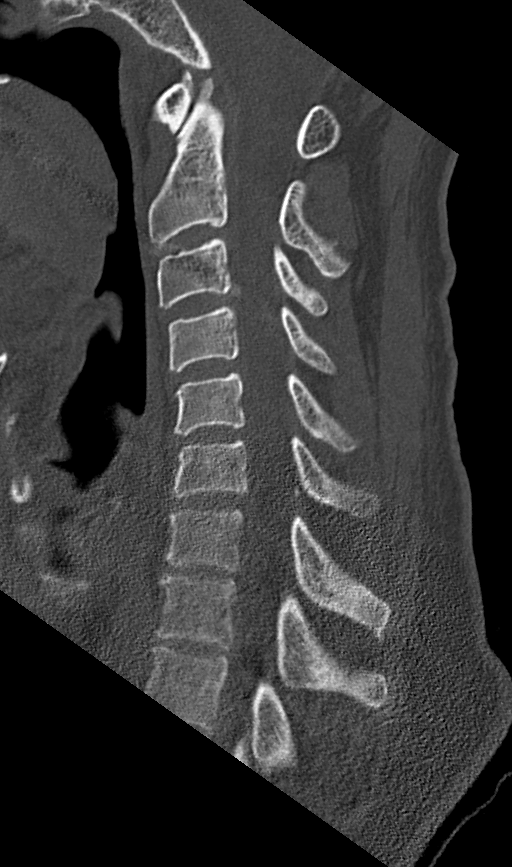
[im 41/70  bone]
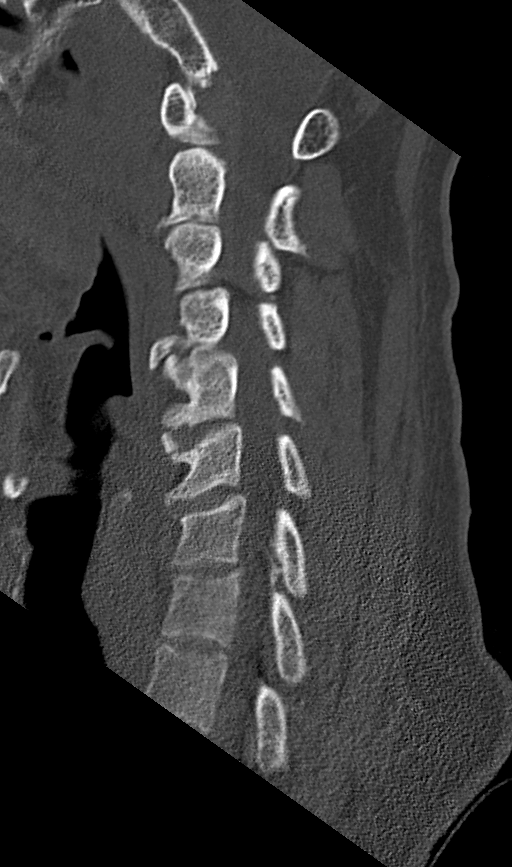
[im 47/70  bone]
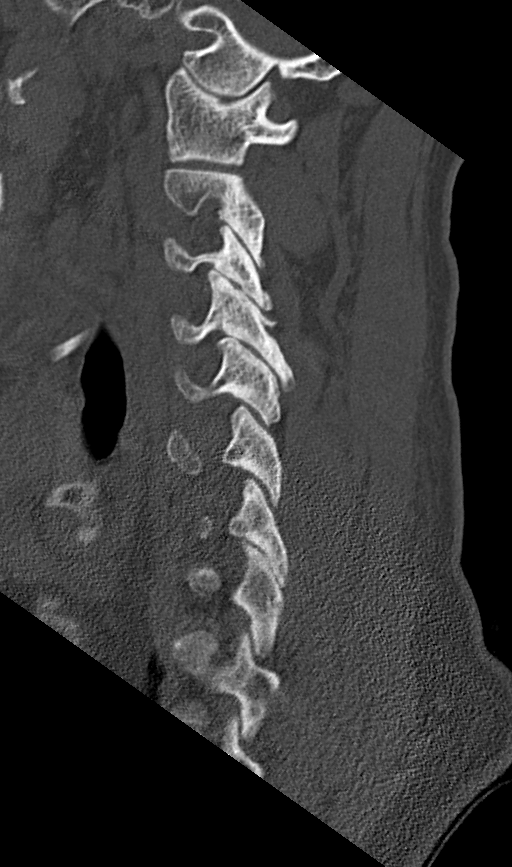

[Series 6: coronal bone · coronal · 0.27mm/px · 3 of 69 slices shown]
[im 22/69  bone]
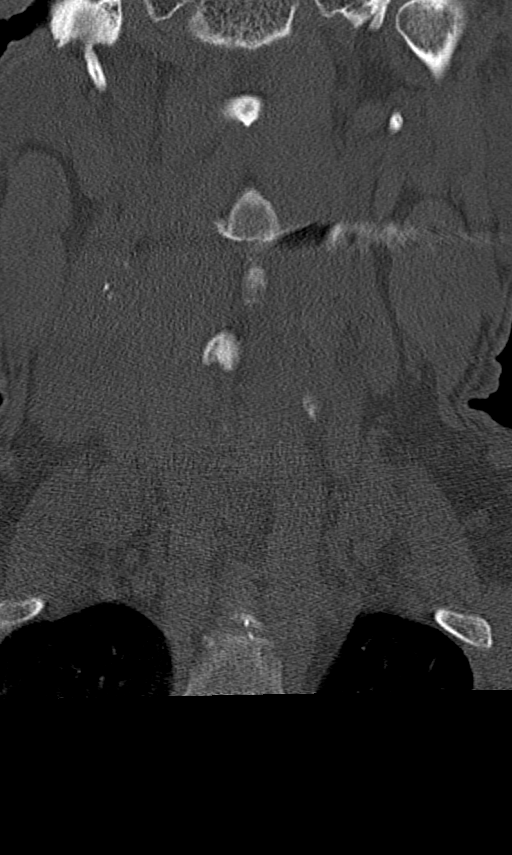
[im 30/69  bone]
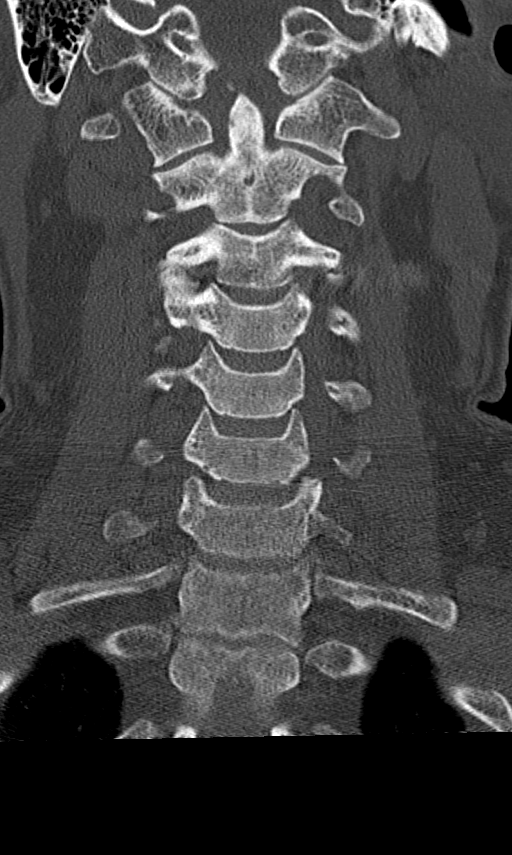
[im 39/69  bone]
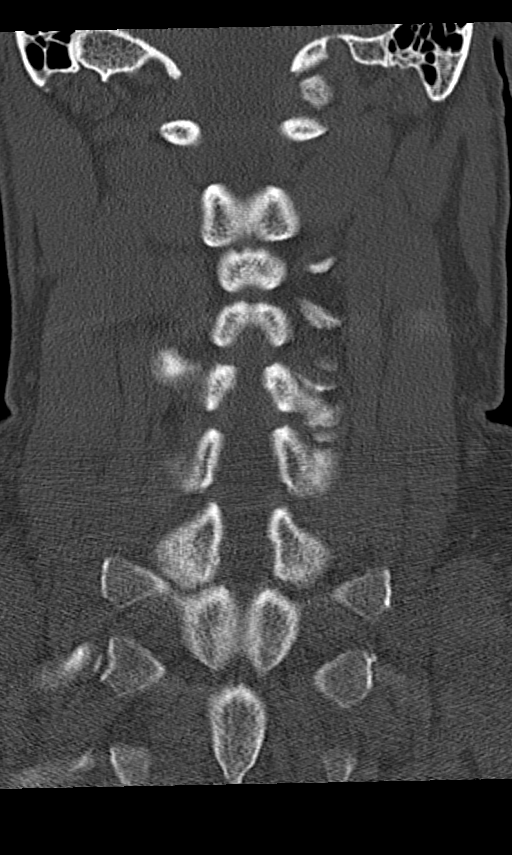

[Series 7: orthogonal axials · axial · 0.21mm/px · z∈[-165,-63]mm · 3 of 95 slices shown, 4 images]
[im 16/95  soft-tissue]
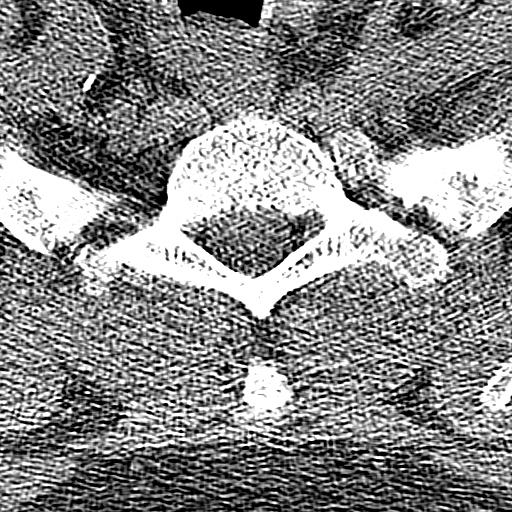
[im 16/95  bone]
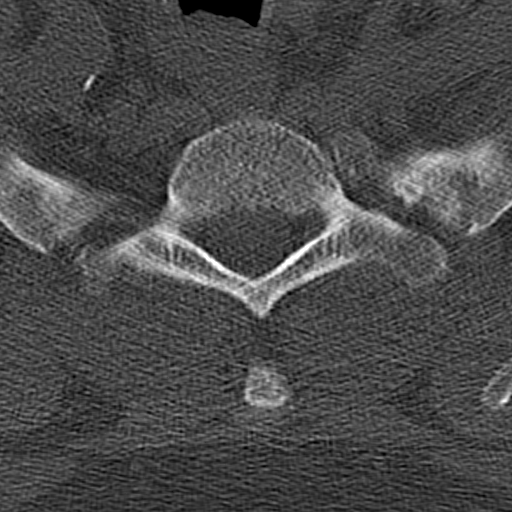
[im 48/95  bone]
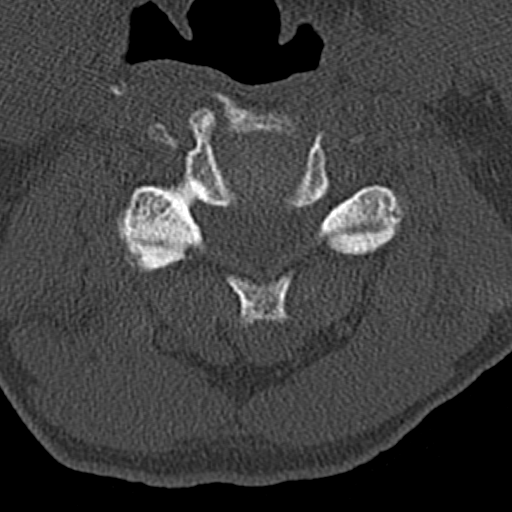
[im 79/95  bone]
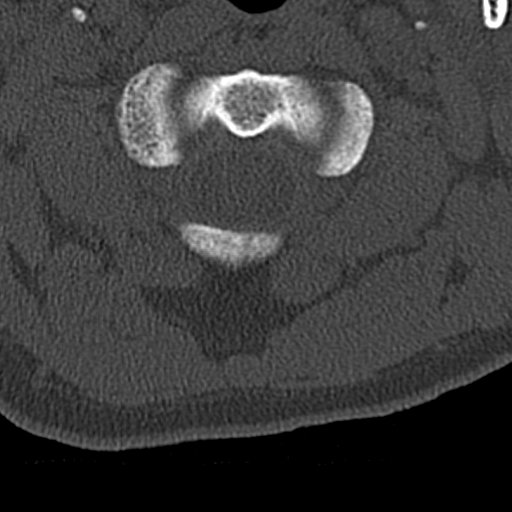

[11 of 33 positions shown; findings below may reference images not displayed]

FINDINGS: Alignment: Normal.

Skull base and vertebrae: No acute fracture. No primary bone lesion
or focal pathologic process.

Soft tissues and spinal canal: No prevertebral fluid or swelling. No
visible canal hematoma.

Disc levels: Moderate marked severity anterior osteophyte formation
is seen at the levels of C4-C5, C5-C6 and C6-C7.

There is mild narrowing of the anterior atlantoaxial articulation.
Vertebral body height is preserved throughout the remainder of the
cervical spine.

Bilateral moderate severity multilevel facet joint hypertrophy is
noted.

Upper chest: Negative.

Other: Multiple small shrapnel fragments are seen within the para
mandibular soft tissues on the left.
IMPRESSION: 1. No acute fracture or subluxation in the cervical spine.
2. Moderate severity degenerative changes at the levels of C4-C5,
C5-C6 and C6-C7.
3. Multiple small shrapnel fragments are seen within the para
mandibular soft tissues on the left.
